# Patient Record
Sex: Female | Born: 1980 | Hispanic: No | Marital: Married | Smoking: Never smoker
Health system: Southern US, Community
[De-identification: ages and names within clinical notes are randomized; demographics above are authoritative.]

## PROBLEM LIST (undated history)

## (undated) ENCOUNTER — Emergency Department (HOSPITAL_COMMUNITY): Admission: EM | Payer: Self-pay

## (undated) DIAGNOSIS — K8020 Calculus of gallbladder without cholecystitis without obstruction: Secondary | ICD-10-CM

## (undated) DIAGNOSIS — Z6839 Body mass index (BMI) 39.0-39.9, adult: Secondary | ICD-10-CM

## (undated) DIAGNOSIS — E119 Type 2 diabetes mellitus without complications: Secondary | ICD-10-CM

## (undated) DIAGNOSIS — A048 Other specified bacterial intestinal infections: Secondary | ICD-10-CM

## (undated) DIAGNOSIS — I8393 Asymptomatic varicose veins of bilateral lower extremities: Secondary | ICD-10-CM

## (undated) HISTORY — DX: Asymptomatic varicose veins of bilateral lower extremities: I83.93

## (undated) HISTORY — DX: Type 2 diabetes mellitus without complications: E11.9

## (undated) HISTORY — DX: Other specified bacterial intestinal infections: A04.8

## (undated) HISTORY — DX: Calculus of gallbladder without cholecystitis without obstruction: K80.20

---

## 2001-01-19 ENCOUNTER — Ambulatory Visit (HOSPITAL_COMMUNITY): Admission: RE | Admit: 2001-01-19 | Discharge: 2001-01-19 | Payer: Self-pay | Admitting: *Deleted

## 2001-05-02 ENCOUNTER — Inpatient Hospital Stay (HOSPITAL_COMMUNITY): Admission: AD | Admit: 2001-05-02 | Discharge: 2001-05-05 | Payer: Self-pay | Admitting: *Deleted

## 2002-10-11 ENCOUNTER — Other Ambulatory Visit: Admission: RE | Admit: 2002-10-11 | Discharge: 2002-10-11 | Payer: Self-pay | Admitting: Obstetrics and Gynecology

## 2003-02-22 ENCOUNTER — Inpatient Hospital Stay (HOSPITAL_COMMUNITY): Admission: AD | Admit: 2003-02-22 | Discharge: 2003-02-24 | Payer: Self-pay | Admitting: Obstetrics and Gynecology

## 2004-10-08 ENCOUNTER — Ambulatory Visit (HOSPITAL_COMMUNITY): Admission: RE | Admit: 2004-10-08 | Discharge: 2004-10-08 | Payer: Self-pay | Admitting: Obstetrics & Gynecology

## 2004-10-17 ENCOUNTER — Inpatient Hospital Stay (HOSPITAL_COMMUNITY): Admission: AD | Admit: 2004-10-17 | Discharge: 2004-10-17 | Payer: Self-pay | Admitting: Obstetrics and Gynecology

## 2004-10-29 ENCOUNTER — Inpatient Hospital Stay (HOSPITAL_COMMUNITY): Admission: AD | Admit: 2004-10-29 | Discharge: 2004-10-31 | Payer: Self-pay | Admitting: Obstetrics

## 2011-11-17 DIAGNOSIS — K8020 Calculus of gallbladder without cholecystitis without obstruction: Secondary | ICD-10-CM

## 2011-11-17 HISTORY — DX: Calculus of gallbladder without cholecystitis without obstruction: K80.20

## 2012-09-30 ENCOUNTER — Inpatient Hospital Stay (HOSPITAL_COMMUNITY)
Admission: EM | Admit: 2012-09-30 | Discharge: 2012-10-06 | DRG: 419 | Disposition: A | Discharge status: Home / Self Care | Payer: Medicaid Other | Attending: General Surgery | Admitting: General Surgery

## 2012-09-30 ENCOUNTER — Emergency Department (HOSPITAL_COMMUNITY): Payer: Medicaid Other

## 2012-09-30 ENCOUNTER — Encounter (HOSPITAL_COMMUNITY): Payer: Self-pay | Admitting: *Deleted

## 2012-09-30 DIAGNOSIS — Z6839 Body mass index (BMI) 39.0-39.9, adult: Secondary | ICD-10-CM

## 2012-09-30 DIAGNOSIS — Z6841 Body Mass Index (BMI) 40.0 and over, adult: Secondary | ICD-10-CM

## 2012-09-30 DIAGNOSIS — K819 Cholecystitis, unspecified: Secondary | ICD-10-CM

## 2012-09-30 DIAGNOSIS — K8010 Calculus of gallbladder with chronic cholecystitis without obstruction: Secondary | ICD-10-CM

## 2012-09-30 DIAGNOSIS — E669 Obesity, unspecified: Secondary | ICD-10-CM | POA: Diagnosis present

## 2012-09-30 DIAGNOSIS — K8001 Calculus of gallbladder with acute cholecystitis with obstruction: Secondary | ICD-10-CM | POA: Diagnosis present

## 2012-09-30 DIAGNOSIS — K8063 Calculus of gallbladder and bile duct with acute cholecystitis with obstruction: Principal | ICD-10-CM | POA: Diagnosis present

## 2012-09-30 DIAGNOSIS — K8050 Calculus of bile duct without cholangitis or cholecystitis without obstruction: Secondary | ICD-10-CM

## 2012-09-30 DIAGNOSIS — K7689 Other specified diseases of liver: Secondary | ICD-10-CM | POA: Diagnosis present

## 2012-09-30 DIAGNOSIS — R7989 Other specified abnormal findings of blood chemistry: Secondary | ICD-10-CM | POA: Diagnosis present

## 2012-09-30 HISTORY — DX: Body mass index (bmi) 39.0-39.9, adult: Z68.39

## 2012-09-30 LAB — COMPREHENSIVE METABOLIC PANEL
ALT: 1138 U/L — ABNORMAL HIGH (ref 0–35)
AST: 382 U/L — ABNORMAL HIGH (ref 0–37)
Albumin: 4 g/dL (ref 3.5–5.2)
Calcium: 9.7 mg/dL (ref 8.4–10.5)
Creatinine, Ser: 0.56 mg/dL (ref 0.50–1.10)
GFR calc non Af Amer: >90 mL/min (ref >90)
Sodium: 140 mEq/L (ref 135–145)
Total Protein: 8.3 g/dL (ref 6.0–8.3)

## 2012-09-30 LAB — CBC WITH DIFFERENTIAL/PLATELET
Basophils Absolute: 0 10*3/uL (ref 0.0–0.1)
Basophils Relative: 0 % (ref 0–1)
Eosinophils Absolute: 0.1 10*3/uL (ref 0.0–0.7)
Eosinophils Relative: 1 % (ref 0–5)
HCT: 40.1 % (ref 36.0–46.0)
Lymphocytes Relative: 27 % (ref 12–46)
MCHC: 33.7 g/dL (ref 30.0–36.0)
MCV: 85 fL (ref 78.0–100.0)
Monocytes Absolute: 0.3 10*3/uL (ref 0.1–1.0)
Platelets: 341 10*3/uL (ref 150–400)
RDW: 13.6 % (ref 11.5–15.5)
WBC: 8.1 10*3/uL (ref 4.0–10.5)

## 2012-09-30 LAB — URINE MICROSCOPIC-ADD ON

## 2012-09-30 LAB — URINALYSIS, ROUTINE W REFLEX MICROSCOPIC
Glucose, UA: NEGATIVE mg/dL
Ketones, ur: >80 mg/dL — AB
Nitrite: NEGATIVE
Protein, ur: 30 mg/dL — AB
Specific Gravity, Urine: 1.028 (ref 1.005–1.030)
Urobilinogen, UA: 2 mg/dL — ABNORMAL HIGH (ref 0.0–1.0)
pH: 6.5 (ref 5.0–8.0)

## 2012-09-30 MED ORDER — MORPHINE SULFATE 4 MG/ML IJ SOLN
4.0000 mg | Freq: Once | INTRAMUSCULAR | Status: AC
  Administered 2012-09-30: 4 mg via INTRAVENOUS
  Filled 2012-09-30: qty 1

## 2012-09-30 MED ORDER — PIPERACILLIN-TAZOBACTAM 3.375 G IVPB
3.3750 g | Freq: Once | INTRAVENOUS | Status: AC
  Administered 2012-09-30: 3.375 g via INTRAVENOUS
  Filled 2012-09-30: qty 50

## 2012-09-30 MED ORDER — CIPROFLOXACIN IN D5W 400 MG/200ML IV SOLN
400.0000 mg | Freq: Two times a day (BID) | INTRAVENOUS | Status: DC
  Administered 2012-10-01 – 2012-10-05 (×9): 400 mg via INTRAVENOUS
  Filled 2012-09-30 (×11): qty 200

## 2012-09-30 MED ORDER — SODIUM CHLORIDE 0.9 % IV BOLUS (SEPSIS)
1000.0000 mL | Freq: Once | INTRAVENOUS | Status: AC
  Administered 2012-09-30: 1000 mL via INTRAVENOUS

## 2012-09-30 MED ORDER — KCL IN DEXTROSE-NACL 20-5-0.9 MEQ/L-%-% IV SOLN
INTRAVENOUS | Status: DC
  Administered 2012-10-01: 01:00:00 via INTRAVENOUS
  Filled 2012-09-30 (×4): qty 1000

## 2012-09-30 MED ORDER — ONDANSETRON HCL 4 MG/2ML IJ SOLN
4.0000 mg | Freq: Once | INTRAMUSCULAR | Status: AC
  Administered 2012-09-30: 4 mg via INTRAVENOUS
  Filled 2012-09-30: qty 2

## 2012-09-30 MED ORDER — HYDROMORPHONE HCL PF 1 MG/ML IJ SOLN
1.0000 mg | INTRAMUSCULAR | Status: DC | PRN
Start: 2012-09-30 — End: 2012-10-06
  Administered 2012-10-01 – 2012-10-03 (×4): 1 mg via INTRAVENOUS
  Filled 2012-09-30 (×4): qty 1

## 2012-09-30 MED ORDER — ONDANSETRON HCL 4 MG/2ML IJ SOLN
4.0000 mg | Freq: Four times a day (QID) | INTRAMUSCULAR | Status: DC | PRN
  Administered 2012-10-02: 4 mg via INTRAVENOUS
  Filled 2012-09-30 (×4): qty 2

## 2012-09-30 MED ORDER — SODIUM CHLORIDE 0.9 % IV SOLN
Freq: Once | INTRAVENOUS | Status: AC
Start: 2012-09-30 — End: 2012-09-30
  Administered 2012-09-30: 75 mL/h via INTRAVENOUS

## 2012-09-30 NOTE — ED Notes (Signed)
Per family, pt reports intermittent abdominal pain/nausea/vomiting x8 days. Denies difficulty/pain with urination. States "it hurts when I sit on the toilet".

## 2012-09-30 NOTE — ED Notes (Signed)
Dr. Rancour at bedside. 

## 2012-09-30 NOTE — H&P (Signed)
Vicki Roberts is an 31 y.o. female.   Chief Complaint: abdominal pain HPI: 1 week history of intermittent RUQ abdominal pain.  Worse over last day.  Pain is sharp and located in RUQ.  Pt has nausea and vomiting.  U/S shows gallstones and CBD 11 mm.  Slight elevation of LFT's.  History reviewed. No pertinent past medical history.  History reviewed. No pertinent past surgical history.  No family history on file. Social History:  reports that she has never smoked. She does not have any smokeless tobacco history on file. She reports that she does not drink alcohol. Her drug history not on file.  Allergies: No Known Allergies   (Not in a hospital admission)  Results for orders placed during the hospital encounter of 09/30/12 (from the past 48 hour(s))  CBC WITH DIFFERENTIAL     Status: Normal   Collection Time   09/30/12  5:44 PM      Component Value Range Comment   WBC 8.1  4.0 - 10.5 K/uL    RBC 4.72  3.87 - 5.11 MIL/uL    Hemoglobin 13.5  12.0 - 15.0 g/dL    HCT 16.1  09.6 - 04.5 %    MCV 85.0  78.0 - 100.0 fL    MCH 28.6  26.0 - 34.0 pg    MCHC 33.7  30.0 - 36.0 g/dL    RDW 40.9  81.1 - 91.4 %    Platelets 341  150 - 400 K/uL    Neutrophils Relative 68  43 - 77 %    Neutro Abs 5.5  1.7 - 7.7 K/uL    Lymphocytes Relative 27  12 - 46 %    Lymphs Abs 2.2  0.7 - 4.0 K/uL    Monocytes Relative 4  3 - 12 %    Monocytes Absolute 0.3  0.1 - 1.0 K/uL    Eosinophils Relative 1  0 - 5 %    Eosinophils Absolute 0.1  0.0 - 0.7 K/uL    Basophils Relative 0  0 - 1 %    Basophils Absolute 0.0  0.0 - 0.1 K/uL   COMPREHENSIVE METABOLIC PANEL     Status: Abnormal   Collection Time   09/30/12  5:44 PM      Component Value Range Comment   Sodium 140  135 - 145 mEq/L    Potassium 3.4 (*) 3.5 - 5.1 mEq/L    Chloride 99  96 - 112 mEq/L    CO2 31  19 - 32 mEq/L    Glucose, Bld 125 (*) 70 - 99 mg/dL    BUN 6  6 - 23 mg/dL    Creatinine, Ser 7.82  0.50 - 1.10 mg/dL    Calcium 9.7  8.4 -  10.5 mg/dL    Total Protein 8.3  6.0 - 8.3 g/dL    Albumin 4.0  3.5 - 5.2 g/dL    AST 956 (*) 0 - 37 U/L    ALT 1138 (*) 0 - 35 U/L    Alkaline Phosphatase 167 (*) 39 - 117 U/L    Total Bilirubin 2.2 (*) 0.3 - 1.2 mg/dL    GFR calc non Af Amer >90  >90 mL/min    GFR calc Af Amer >90  >90 mL/min   LIPASE, BLOOD     Status: Normal   Collection Time   09/30/12  5:44 PM      Component Value Range Comment   Lipase 27  11 -  59 U/L   URINALYSIS, ROUTINE W REFLEX MICROSCOPIC     Status: Abnormal   Collection Time   09/30/12  9:52 PM      Component Value Range Comment   Color, Urine AMBER (*) YELLOW BIOCHEMICALS MAY BE AFFECTED BY COLOR   APPearance HAZY (*) CLEAR    Specific Gravity, Urine 1.028  1.005 - 1.030    pH 6.5  5.0 - 8.0    Glucose, UA NEGATIVE  NEGATIVE mg/dL    Hgb urine dipstick MODERATE (*) NEGATIVE    Bilirubin Urine MODERATE (*) NEGATIVE    Ketones, ur >80 (*) NEGATIVE mg/dL    Protein, ur 30 (*) NEGATIVE mg/dL    Urobilinogen, UA 2.0 (*) 0.0 - 1.0 mg/dL    Nitrite NEGATIVE  NEGATIVE    Leukocytes, UA SMALL (*) NEGATIVE   PREGNANCY, URINE     Status: Normal   Collection Time   09/30/12  9:52 PM      Component Value Range Comment   Preg Test, Ur NEGATIVE  NEGATIVE   URINE MICROSCOPIC-ADD ON     Status: Abnormal   Collection Time   09/30/12  9:52 PM      Component Value Range Comment   Squamous Epithelial / LPF MANY (*) RARE    WBC, UA 0-2  <3 WBC/hpf    RBC / HPF 3-6  <3 RBC/hpf    Bacteria, UA RARE  RARE    Urine-Other MUCOUS PRESENT      US Abdomen Complete  09/30/2012  *RADIOLOGY REPORT*  Clinical Data:  Abdominal pain with nausea vomiting for 8-9 days.  COMPLETE ABDOMINAL ULTRASOUND  Comparison:  None.  Findings:  Gallbladder:  Abnormal gallbladder with multiple shadowing stones. Wall thickness 6.2 mm.  Positive Murphy's sign of pericholecystic fluid.  Common bile duct:  Increased in size measuring 11 mm extrahepatic location.  Liver:  Moderate enlargement.   Increased echogenicity. No focal defects.  No definite intrahepatic ductal dilatation.  IVC:  Appears normal.  Pancreas:  No focal abnormality seen.  Spleen:  Normal measuring 5.7 cm.  Right Kidney:  Normal measuring 12.3 cm.  Left Kidney:  Overall normal in size.  A cystic area in the upper pole measures  1.0 x 1.2 x 1.3 cm.  Abdominal aorta:  No aneurysm identified.  IMPRESSION: Cholelithiasis with concern for acute cholecystitis.  Dilated common bile duct of 11 mm concerning for common bile duct stone. Hepatomegaly.   Original Report Authenticated By: Davonna Belling, M.D.     Review of Systems  Constitutional: Negative.   HENT: Negative.   Eyes: Negative.   Cardiovascular: Negative.   Gastrointestinal: Positive for nausea, vomiting and abdominal pain.  Genitourinary: Negative.   Musculoskeletal: Negative.   Skin: Negative.   Neurological: Negative.   Endo/Heme/Allergies: Negative.   Psychiatric/Behavioral: Negative.     Blood pressure 129/73, pulse 66, temperature 98.2 F (36.8 C), temperature source Oral, resp. rate 20, last menstrual period 09/23/2012, SpO2 99.00%. Physical Exam  Constitutional: She is oriented to person, place, and time. She appears well-developed and well-nourished.  HENT:  Head: Normocephalic and atraumatic.  Eyes: EOM are normal. Pupils are equal, round, and reactive to light. No scleral icterus.  Neck: Normal range of motion. Neck supple.  Cardiovascular: Normal rate and regular rhythm.   Respiratory: Effort normal and breath sounds normal.  GI: There is tenderness in the right upper quadrant and epigastric area. There is positive Murphy's sign.  Musculoskeletal: Normal range of motion.  Neurological: She  is alert and oriented to person, place, and time.  Skin: Skin is warm and dry.  Psychiatric: She has a normal mood and affect. Her behavior is normal. Judgment and thought content normal.     Assessment/Plan Acute cholecystitis ADMIT IVF ABX NPO OR IN  AM.  The procedure has been discussed with the patient. Operative and non operative treatments have been discussed. Risks of surgery include bleeding, infection,  Common bile duct injury,  Injury to the stomach,liver, colon,small intestine, abdominal wall,  Diaphragm,  Major blood vessels,  And the need for an open procedure.  Other risks include worsening of medical problems, death,  DVT and pulmonary embolism, and cardiovascular events.   Medical options have also been discussed. The patient has been informed of long term expectations of surgery and non surgical options,  The patient agrees to proceed.    Hadas Jessop A. 09/30/2012, 10:37 PM

## 2012-09-30 NOTE — ED Notes (Signed)
The pt has had mid abd pain for 8-9 days with n and vomiting.  lmp last friday

## 2012-09-30 NOTE — ED Provider Notes (Signed)
History  This chart was scribed for Vicki Octave, MD by Bennett Scrape, ED Scribe. This patient was seen in room A01C/A01C and the patient's care was started at 7:38 PM.  CSN: [id number]  Arrival date & time 09/30/12  1711   First MD Initiated Contact with Patient 09/30/12 1938      Chief Complaint  Patient presents with  . Abdominal Pain    The history is provided by the patient. No language interpreter was used (Family in room provided translation).    Vicki Roberts is a 31 y.o. female who presents to the Emergency Department complaining of 8 to 9 days of intermittent, non-radiating epigastric abdominal pain with associated nausea and emesis, last episode occuring 3 hours ago. She denies fevers, urinary symptoms, vaginal bleeding, and CP as associated symptoms. She reports that her LNMP was 09/23/12. She denies being on birth control currently. She denies having any prior surgeries and doesn't have a h/o chronic medical conditions. She denies smoking and alcohol use.  History reviewed. No pertinent past medical history.  History reviewed. No pertinent past surgical history.  No family history on file.  History  Substance Use Topics  . Smoking status: Never Smoker   . Smokeless tobacco: Not on file  . Alcohol Use: No    .No OB history provided.  Review of Systems  A complete 10 system review of systems was obtained and all systems are negative except as noted in the HPI and PMH.   Allergies  Review of patient's allergies indicates no known allergies.  Home Medications   Current Outpatient Rx  Name  Route  Sig  Dispense  Refill  . ACETAMINOPHEN 500 MG PO TABS   Oral   Take 500 mg by mouth every 6 (six) hours as needed. For pain           Triage Vitals: BP 129/73  Pulse 66  Temp 98.2 F (36.8 C) (Oral)  Resp 20  SpO2 99%  LMP 09/23/2012  Physical Exam  Nursing note and vitals reviewed. Constitutional: She is oriented to person, place, and time.  She appears well-developed and well-nourished. No distress.  HENT:  Head: Normocephalic and atraumatic.  Mouth/Throat: Oropharynx is clear and moist.  Eyes: Conjunctivae normal and EOM are normal. Pupils are equal, round, and reactive to light.  Neck: Neck supple. No tracheal deviation present.  Cardiovascular: Normal rate, regular rhythm, normal heart sounds and intact distal pulses.   Pulmonary/Chest: Effort normal and breath sounds normal. No respiratory distress.  Abdominal: Soft. There is tenderness (moderate tenderness in the RUQ and epigastrium ). There is guarding (mild). There is no rebound.       No CVA tenderness  Musculoskeletal: Normal range of motion.  Neurological: She is alert and oriented to person, place, and time.  Skin: Skin is warm and dry.  Psychiatric: She has a normal mood and affect. Her behavior is normal.    ED Course  Procedures (including critical care time)  DIAGNOSTIC STUDIES: Oxygen Saturation is 99% on room air, normal by my interpretation.    COORDINATION OF CARE:  7:49 PM- Discussed treatment plan which includes US of the abdomen with pt at bedside and pt agreed to plan.  8:00 PM- Ordered 1,000 mL of Bolus, 4 mg injection of Zofran and 4 mg injection of morphine.  9:25 PM-Consult complete with general surgery. Patient case explained and discussed. General surgery agrees to admission patient for further evaluation and treatment. Requests that I call GI for  admission. Call ended at 9:26 PM.  9:37 PM- Consult complete with Dr. Laural Benes, GI. Patient case explained and discussed. Dr. Laural Benes, GI agrees to admit patient for further evaluation and treatment. Call ended at 9:38 PM.  9:45 PM- Ordered 3.375 g of Zosyn and 1,000 mL bolus  10:13 PM- Informed pt of consults and plans for admission with cholecystectomy scheduled in the morning. Pt is agreeable at this time to this plan.  Labs Reviewed  URINALYSIS, ROUTINE W REFLEX MICROSCOPIC - Abnormal;  Notable for the following:    Color, Urine AMBER (*)  BIOCHEMICALS MAY BE AFFECTED BY COLOR   APPearance HAZY (*)     Hgb urine dipstick MODERATE (*)     Bilirubin Urine MODERATE (*)     Ketones, ur >80 (*)     Protein, ur 30 (*)     Urobilinogen, UA 2.0 (*)     Leukocytes, UA SMALL (*)     All other components within normal limits  COMPREHENSIVE METABOLIC PANEL - Abnormal; Notable for the following:    Potassium 3.4 (*)     Glucose, Bld 125 (*)     AST 382 (*)     ALT 1138 (*)     Alkaline Phosphatase 167 (*)     Total Bilirubin 2.2 (*)     All other components within normal limits  URINE MICROSCOPIC-ADD ON - Abnormal; Notable for the following:    Squamous Epithelial / LPF MANY (*)     All other components within normal limits  PREGNANCY, URINE  CBC WITH DIFFERENTIAL  LIPASE, BLOOD   US Abdomen Complete  09/30/2012  *RADIOLOGY REPORT*  Clinical Data:  Abdominal pain with nausea vomiting for 8-9 days.  COMPLETE ABDOMINAL ULTRASOUND  Comparison:  None.  Findings:  Gallbladder:  Abnormal gallbladder with multiple shadowing stones. Wall thickness 6.2 mm.  Positive Murphy's sign of pericholecystic fluid.  Common bile duct:  Increased in size measuring 11 mm extrahepatic location.  Liver:  Moderate enlargement.  Increased echogenicity. No focal defects.  No definite intrahepatic ductal dilatation.  IVC:  Appears normal.  Pancreas:  No focal abnormality seen.  Spleen:  Normal measuring 5.7 cm.  Right Kidney:  Normal measuring 12.3 cm.  Left Kidney:  Overall normal in size.  A cystic area in the upper pole measures  1.0 x 1.2 x 1.3 cm.  Abdominal aorta:  No aneurysm identified.  IMPRESSION: Cholelithiasis with concern for acute cholecystitis.  Dilated common bile duct of 11 mm concerning for common bile duct stone. Hepatomegaly.   Original Report Authenticated By: Davonna Belling, M.D.      No diagnosis found.    MDM  Epigastric pain for the past 8 days with nausea and vomiting. No fever,  chills, change in bowel habits.  TTP RUQ with guarding.  Elevated LFTs.  Will obtain US.  Ultrasound shows acute cholecystitis with large common bile duct stone. Discussed with Dr. Rosezena Sensor nurse. He will admit the patient but would like a GI consult. Discussed with Dr. Laural Benes who treat the patient information and will see the patient in the morning.   I personally performed the services described in this documentation, which was scribed in my presence. The recorded information has been reviewed and is accurate.    Vicki Octave, MD 09/30/12 2241

## 2012-10-01 ENCOUNTER — Encounter (HOSPITAL_COMMUNITY): Payer: Self-pay | Admitting: *Deleted

## 2012-10-01 ENCOUNTER — Encounter (HOSPITAL_COMMUNITY): Admission: EM | Disposition: A | Discharge status: Home / Self Care | Payer: Self-pay | Source: Home / Self Care

## 2012-10-01 ENCOUNTER — Observation Stay (HOSPITAL_COMMUNITY): Payer: Medicaid Other | Admitting: *Deleted

## 2012-10-01 HISTORY — PX: CHOLECYSTECTOMY: SHX55

## 2012-10-01 LAB — COMPREHENSIVE METABOLIC PANEL
ALT: 760 U/L — ABNORMAL HIGH (ref 0–35)
AST: 145 U/L — ABNORMAL HIGH (ref 0–37)
Alkaline Phosphatase: 134 U/L — ABNORMAL HIGH (ref 39–117)
CO2: 28 mEq/L (ref 19–32)
Calcium: 8.3 mg/dL — ABNORMAL LOW (ref 8.4–10.5)
Creatinine, Ser: 0.66 mg/dL (ref 0.50–1.10)
Sodium: 139 mEq/L (ref 135–145)
Total Bilirubin: 1.1 mg/dL (ref 0.3–1.2)
Total Protein: 6.9 g/dL (ref 6.0–8.3)

## 2012-10-01 LAB — CBC
MCHC: 33.7 g/dL (ref 30.0–36.0)
MCV: 86.8 fL (ref 78.0–100.0)
Platelets: 317 10*3/uL (ref 150–400)
RDW: 13.9 % (ref 11.5–15.5)
WBC: 7.8 10*3/uL (ref 4.0–10.5)

## 2012-10-01 SURGERY — LAPAROSCOPIC CHOLECYSTECTOMY WITH INTRAOPERATIVE CHOLANGIOGRAM
Anesthesia: General | Site: Abdomen | Wound class: Clean Contaminated

## 2012-10-01 MED ORDER — SODIUM CHLORIDE 0.9 % IR SOLN
Status: DC | PRN
  Administered 2012-10-01: 1000 mL

## 2012-10-01 MED ORDER — GLYCOPYRROLATE 0.2 MG/ML IJ SOLN
INTRAMUSCULAR | Status: DC | PRN
  Administered 2012-10-01: 0.6 mg via INTRAVENOUS

## 2012-10-01 MED ORDER — ALBUTEROL SULFATE HFA 108 (90 BASE) MCG/ACT IN AERS
INHALATION_SPRAY | RESPIRATORY_TRACT | Status: DC | PRN
  Administered 2012-10-01: 2 via RESPIRATORY_TRACT

## 2012-10-01 MED ORDER — LACTATED RINGERS IV SOLN
INTRAVENOUS | Status: DC | PRN
  Administered 2012-10-01 (×2): via INTRAVENOUS

## 2012-10-01 MED ORDER — NEOSTIGMINE METHYLSULFATE 1 MG/ML IJ SOLN
INTRAMUSCULAR | Status: DC | PRN
  Administered 2012-10-01: 4 mg via INTRAVENOUS

## 2012-10-01 MED ORDER — HYDROMORPHONE HCL PF 1 MG/ML IJ SOLN
0.2500 mg | INTRAMUSCULAR | Status: DC | PRN
  Administered 2012-10-01 (×3): 0.5 mg via INTRAVENOUS

## 2012-10-01 MED ORDER — INFLUENZA VIRUS VACC SPLIT PF IM SUSP
0.5000 mL | INTRAMUSCULAR | Status: AC
  Filled 2012-10-01: qty 0.5

## 2012-10-01 MED ORDER — ACETAMINOPHEN 325 MG PO TABS
650.0000 mg | ORAL_TABLET | Freq: Four times a day (QID) | ORAL | Status: DC | PRN
  Administered 2012-10-04 – 2012-10-05 (×2): 650 mg via ORAL
  Filled 2012-10-01 (×2): qty 2

## 2012-10-01 MED ORDER — LIDOCAINE HCL (CARDIAC) 20 MG/ML IV SOLN
INTRAVENOUS | Status: DC | PRN
  Administered 2012-10-01: 80 mg via INTRAVENOUS

## 2012-10-01 MED ORDER — MIDAZOLAM HCL 5 MG/5ML IJ SOLN
INTRAMUSCULAR | Status: DC | PRN
  Administered 2012-10-01: 2 mg via INTRAVENOUS

## 2012-10-01 MED ORDER — ACETAMINOPHEN 650 MG RE SUPP: 650.0000 mg | Freq: Four times a day (QID) | RECTAL | Status: DC | PRN

## 2012-10-01 MED ORDER — ONDANSETRON HCL 4 MG/2ML IJ SOLN
INTRAMUSCULAR | Status: DC | PRN
  Administered 2012-10-01: 4 mg via INTRAVENOUS

## 2012-10-01 MED ORDER — OXYCODONE HCL 5 MG PO TABS
5.0000 mg | ORAL_TABLET | ORAL | Status: DC | PRN
  Administered 2012-10-01 – 2012-10-04 (×8): 5 mg via ORAL
  Filled 2012-10-01 (×4): qty 1
  Filled 2012-10-01: qty 2
  Filled 2012-10-01 (×2): qty 1

## 2012-10-01 MED ORDER — ROCURONIUM BROMIDE 100 MG/10ML IV SOLN
INTRAVENOUS | Status: DC | PRN
  Administered 2012-10-01: 10 mg via INTRAVENOUS
  Administered 2012-10-01: 40 mg via INTRAVENOUS

## 2012-10-01 MED ORDER — CEFAZOLIN SODIUM 1-5 GM-% IV SOLN
2.0000 g | Freq: Once | INTRAVENOUS | Status: AC
  Administered 2012-10-01: 2 g via INTRAVENOUS
  Filled 2012-10-01: qty 100

## 2012-10-01 MED ORDER — SODIUM CHLORIDE 0.9 % IV SOLN
INTRAVENOUS | Status: DC
  Administered 2012-10-01 – 2012-10-04 (×5): via INTRAVENOUS

## 2012-10-01 MED ORDER — ONDANSETRON HCL 4 MG/2ML IJ SOLN
4.0000 mg | Freq: Four times a day (QID) | INTRAMUSCULAR | Status: DC | PRN
  Administered 2012-10-01 – 2012-10-03 (×4): 4 mg via INTRAVENOUS
  Filled 2012-10-01: qty 2

## 2012-10-01 MED ORDER — DEXAMETHASONE SODIUM PHOSPHATE 4 MG/ML IJ SOLN
INTRAMUSCULAR | Status: DC | PRN
  Administered 2012-10-01: 4 mg via INTRAVENOUS

## 2012-10-01 MED ORDER — HEMOSTATIC AGENTS (NO CHARGE) OPTIME
TOPICAL | Status: DC | PRN
  Administered 2012-10-01: 1 via TOPICAL

## 2012-10-01 MED ORDER — FENTANYL CITRATE 0.05 MG/ML IJ SOLN
INTRAMUSCULAR | Status: DC | PRN
  Administered 2012-10-01: 100 ug via INTRAVENOUS
  Administered 2012-10-01 (×3): 50 ug via INTRAVENOUS

## 2012-10-01 MED ORDER — SUCCINYLCHOLINE CHLORIDE 20 MG/ML IJ SOLN
INTRAMUSCULAR | Status: DC | PRN
  Administered 2012-10-01: 100 mg via INTRAVENOUS

## 2012-10-01 MED ORDER — PROPOFOL 10 MG/ML IV BOLUS
INTRAVENOUS | Status: DC | PRN
  Administered 2012-10-01: 160 mg via INTRAVENOUS

## 2012-10-01 MED ORDER — PANTOPRAZOLE SODIUM 40 MG IV SOLR
40.0000 mg | Freq: Every day | INTRAVENOUS | Status: DC
  Administered 2012-10-01 – 2012-10-03 (×3): 40 mg via INTRAVENOUS
  Filled 2012-10-01 (×4): qty 40

## 2012-10-01 SURGICAL SUPPLY — 35 items
APPLIER CLIP 5 13 M/L LIGAMAX5 (MISCELLANEOUS) ×2
BLADE SURG ROTATE 9660 (MISCELLANEOUS) IMPLANT
CANISTER SUCTION 2500CC (MISCELLANEOUS) ×2 IMPLANT
CHLORAPREP W/TINT 26ML (MISCELLANEOUS) ×2 IMPLANT
CLIP APPLIE 5 13 M/L LIGAMAX5 (MISCELLANEOUS) ×1 IMPLANT
CLOTH BEACON ORANGE TIMEOUT ST (SAFETY) ×2 IMPLANT
COVER MAYO STAND STRL (DRAPES) ×2 IMPLANT
COVER SURGICAL LIGHT HANDLE (MISCELLANEOUS) ×2 IMPLANT
DECANTER SPIKE VIAL GLASS SM (MISCELLANEOUS) ×4 IMPLANT
DERMABOND ADVANCED (GAUZE/BANDAGES/DRESSINGS) ×1
DERMABOND ADVANCED .7 DNX12 (GAUZE/BANDAGES/DRESSINGS) ×1 IMPLANT
DRAPE C-ARM 42X72 X-RAY (DRAPES) ×2 IMPLANT
ELECT REM PT RETURN 9FT ADLT (ELECTROSURGICAL) ×2
ELECTRODE REM PT RTRN 9FT ADLT (ELECTROSURGICAL) ×1 IMPLANT
GLOVE BIO SURGEON STRL SZ7 (GLOVE) ×2 IMPLANT
GLOVE BIOGEL PI IND STRL 7.5 (GLOVE) ×1 IMPLANT
GLOVE BIOGEL PI INDICATOR 7.5 (GLOVE) ×1
GOWN STRL NON-REIN LRG LVL3 (GOWN DISPOSABLE) ×8 IMPLANT
KIT BASIN OR (CUSTOM PROCEDURE TRAY) ×2 IMPLANT
KIT ROOM TURNOVER OR (KITS) ×2 IMPLANT
NS IRRIG 1000ML POUR BTL (IV SOLUTION) ×2 IMPLANT
PAD ARMBOARD 7.5X6 YLW CONV (MISCELLANEOUS) ×2 IMPLANT
POUCH SPECIMEN RETRIEVAL 10MM (ENDOMECHANICALS) ×2 IMPLANT
SCISSORS LAP 5X35 DISP (ENDOMECHANICALS) ×2 IMPLANT
SET CHOLANGIOGRAPH 5 50 .035 (SET/KITS/TRAYS/PACK) ×2 IMPLANT
SET IRRIG TUBING LAPAROSCOPIC (IRRIGATION / IRRIGATOR) ×2 IMPLANT
SLEEVE ENDOPATH XCEL 5M (ENDOMECHANICALS) ×4 IMPLANT
SPECIMEN JAR SMALL (MISCELLANEOUS) ×2 IMPLANT
SUT MNCRL AB 4-0 PS2 18 (SUTURE) ×2 IMPLANT
SUT VICRYL 0 UR6 27IN ABS (SUTURE) ×2 IMPLANT
TOWEL OR 17X24 6PK STRL BLUE (TOWEL DISPOSABLE) ×2 IMPLANT
TOWEL OR 17X26 10 PK STRL BLUE (TOWEL DISPOSABLE) ×2 IMPLANT
TRAY LAPAROSCOPIC (CUSTOM PROCEDURE TRAY) ×2 IMPLANT
TROCAR XCEL BLUNT TIP 100MML (ENDOMECHANICALS) ×2 IMPLANT
TROCAR XCEL NON-BLD 5MMX100MML (ENDOMECHANICALS) ×2 IMPLANT

## 2012-10-01 NOTE — Progress Notes (Signed)
Patient ID: Vicki Roberts, female   DOB: [birth year], 31 y.o.   MRN: [id number]   Consult received- pt is in surgery currently for lap chole and IOC. We will be available for ERCP if needed, and can be arranged for tomorrow or Monday.

## 2012-10-01 NOTE — Preoperative (Signed)
Beta Blockers   Reason not to administer Beta Blockers:Not Applicable 

## 2012-10-01 NOTE — Progress Notes (Signed)
Nutrition Brief Note  Patient identified on the Malnutrition Screening Tool (MST) Report  Body mass index is 39.32 kg/(m^2). Pt meets criteria for obese based on current BMI.   Current diet order is clear liquids due to gall stones with expected surgery.  Labs and medications reviewed.   RD spoke with pt via interpreter. She reports she does not know her usual wt, but has not noticed any recent wt change.  Pt states she may have lost a few pounds to due inability to eat for 2-3 days PTA.  No nutrition interventions warranted at this time. If nutrition issues arise, please consult RD.   Loyce Dys, MS RD LDN Clinical Inpatient Dietitian Pager: [phone] Weekend/After hours pager: [phone]

## 2012-10-01 NOTE — Op Note (Signed)
Preoperative diagnosis: Acute cholecystitis Postoperative diagnosis: SAA Procedure: Laparoscopic cholecystectomy Surgeon: Dr. Harden Mo EBL: 50 cc Specimen: gb and contents Anesthesia: GETA Drains: none Complications: none Sponge and needle counts correct times two Disposition to recovery stable  Indications: 31 yof with ruq pain and u/s consistent with acute cholecystitis.  Her liver function tests were improved this morning.  We discussed proceeding with lap chole today.  This was done via interpreter.  Procedure: After informed consent was obtained, the patient was taken to the operating room.  She was adminstered cefoxitin.  SCDs were placed.  She was then placed under general anesthesia without complication.  She was then prepped and draped in the standard sterile surgical fashion.  A surgical timeout was then performed.  I infiltrated marcaine below the umbilicus.  I then made a vertical incision and entered the fascia sharply. I entered the peritoneum bluntly.  I then placed a 0 Vicryl pursestring suture through the fascia. A Hassan trocar was introduced. I then insufflated the abdomen to 15 mm mercury pressure. I then placed 3 further 5 mm trocars in the epigastrium and right upper quadrant under direct vision without complication. There was noted to be bile staining in the right upper quadrant as well as around the gallbladder. The gallbladder was consistent with acute cholecystitis. I removed the omentum from the gallbladder. The gallbladder was too tense to be able to really grasp so I aspirated it. I then retracted the gallbladder cephalad and lateral. I eventually was able to obtain the critical view of safety. I intended to perform a cholangiogram but the duct was very friable. I clipped the artery and divided it. There may have been more duct I did not want to dissect any further down below as I was concerned of causing injury. I then clipped the duct distally. I evacuated a fair  amount of small stones proximally. So there are stones that appear to be proximal and may be in the common duct right now. I did not do a cholangiogram as the cystic duct was very thin-walled as well. I then clipped the duct 3 times and divided it. I then removed the gallbladder from the liver bed with some difficulty as it was very adherent. I placed in an Endo Catch bag and removed from the umbilicus. I then copiously irrigated this and obtained hemostasis. I placed a piece of Surgicel Snow in the bed of the gallbladder. I then viewed my entry site and there was no evidence of injury. I then removed my Hasson trocar and tied my pursestring suture down. There was still a bit of a defect I placed 2 additional 0 Vicryl figure-of-eight sutures to obliterate this defect. I then removed the trocars and desufflated the abdomen. I then closed these with 4-0 Monocryl and Dermabond. She tolerated this well was extubated in the operating room and transferred to the recovery room in stable condition.

## 2012-10-01 NOTE — Anesthesia Postprocedure Evaluation (Signed)
  Anesthesia Post-op Note  Patient: Vicki Roberts  Procedure(s) Performed: Procedure(s) (LRB) with comments: LAPAROSCOPIC CHOLECYSTECTOMY WITH INTRAOPERATIVE CHOLANGIOGRAM (N/A)  Patient Location: PACU  Anesthesia Type:General  Level of Consciousness: awake  Airway and Oxygen Therapy: Patient Spontanous Breathing  Post-op Pain: mild  Post-op Assessment: Post-op Vital signs reviewed  Post-op Vital Signs: Reviewed  Complications: No apparent anesthesia complications

## 2012-10-01 NOTE — Transfer of Care (Signed)
Immediate Anesthesia Transfer of Care Note  Patient: Vicki Roberts  Procedure(s) Performed: Procedure(s) (LRB) with comments: LAPAROSCOPIC CHOLECYSTECTOMY WITH INTRAOPERATIVE CHOLANGIOGRAM (N/A)  Patient Location: PACU  Anesthesia Type:General  Level of Consciousness: awake, oriented and patient cooperative  Airway & Oxygen Therapy: Patient Spontanous Breathing and Patient connected to nasal cannula oxygen  Post-op Assessment: Report given to PACU RN and Post -op Vital signs reviewed and stable  Post vital signs: Reviewed and stable  Complications: No apparent anesthesia complications

## 2012-10-01 NOTE — Progress Notes (Signed)
Day of Surgery  Subjective: Still with ruq pain  Objective: Vital signs in last 24 hours: Temp:  [98.2 F (36.8 C)-99.1 F (37.3 C)] 98.6 F (37 C) (11/16 0931) Pulse Rate:  [66-76] 72  (11/16 0931) Resp:  [18-20] 20  (11/16 0931) BP: (88-129)/(49-73) 121/69 mmHg (11/16 0931) SpO2:  [98 %-99 %] 99 % (11/16 0931) Weight:  [215 lb (97.523 kg)] 215 lb (97.523 kg) (11/15 2332)    Intake/Output from previous day:   Intake/Output this shift:    GI: obese, tender ruq  Lab Results:   [facility] 10/01/12 0539 09/30/12 1744  WBC 7.8 8.1  HGB 12.6 13.5  HCT 37.4 40.1  PLT 317 341   BMET  Basename 10/01/12 0539 09/30/12 1744  NA 139 140  K 3.2* 3.4*  CL 102 99  CO2 28 31  GLUCOSE 137* 125*  BUN 6 6  CREATININE 0.66 0.56  CALCIUM 8.3* 9.7   PT/INR No results found for this basename: LABPROT:2,INR:2 in the last 72 hours ABG No results found for this basename: PHART:2,PCO2:2,PO2:2,HCO3:2 in the last 72 hours  Studies/Results: US Abdomen Complete  09/30/2012  *RADIOLOGY REPORT*  Clinical Data:  Abdominal pain with nausea vomiting for 8-9 days.  COMPLETE ABDOMINAL ULTRASOUND  Comparison:  None.  Findings:  Gallbladder:  Abnormal gallbladder with multiple shadowing stones. Wall thickness 6.2 mm.  Positive Murphy's sign of pericholecystic fluid.  Common bile duct:  Increased in size measuring 11 mm extrahepatic location.  Liver:  Moderate enlargement.  Increased echogenicity. No focal defects.  No definite intrahepatic ductal dilatation.  IVC:  Appears normal.  Pancreas:  No focal abnormality seen.  Spleen:  Normal measuring 5.7 cm.  Right Kidney:  Normal measuring 12.3 cm.  Left Kidney:  Overall normal in size.  A cystic area in the upper pole measures  1.0 x 1.2 x 1.3 cm.  Abdominal aorta:  No aneurysm identified.  IMPRESSION: Cholelithiasis with concern for acute cholecystitis.  Dilated common bile duct of 11 mm concerning for common bile duct stone. Hepatomegaly.   Original Report  Authenticated By: Davonna Belling, M.D.     Anti-infectives: Anti-infectives     Start     Dose/Rate Route Frequency Ordered Stop   10/01/12 2359   ciprofloxacin (CIPRO) IVPB 400 mg        400 mg 200 mL/hr over 60 Minutes Intravenous 2 times daily 09/30/12 2343     09/30/12 2145   piperacillin-tazobactam (ZOSYN) IVPB 3.375 g        3.375 g 12.5 mL/hr over 240 Minutes Intravenous  Once 09/30/12 2139 10/01/12 0148          Assessment/Plan: Cholecystitis Lfts improved.  Discussed lap chole today through translator I discussed the procedure in detail.   We discussed the risks and benefits of a laparoscopic cholecystectomy and possible cholangiogram including, but not limited to bleeding, infection, injury to surrounding structures such as the intestine or liver, bile leak, retained gallstones, need to convert to an open procedure, prolonged diarrhea, blood clots such as  DVT, common bile duct injury, anesthesia risks, and possible need for additional procedures.  The likelihood of improvement in symptoms and return to the patient's normal status is good. We discussed the typical post-operative recovery course.   [facility] 10/01/2012

## 2012-10-01 NOTE — Anesthesia Preprocedure Evaluation (Addendum)
Anesthesia Evaluation  Patient identified by MRN, date of birth, ID band Patient awake    Reviewed: Allergy & Precautions, H&P , NPO status , Patient's Chart, lab work & pertinent test results  Airway Mallampati: II TM Distance: >3 FB Neck ROM: Full    Dental  (+) Teeth Intact and Dental Advisory Given   Pulmonary neg pulmonary ROS,  breath sounds clear to auscultation        Cardiovascular negative cardio ROS  Rhythm:Regular Rate:Normal     Neuro/Psych negative neurological ROS     GI/Hepatic Neg liver ROS, History of n/v. No vomitting today.   Endo/Other  negative endocrine ROS  Renal/GU negative Renal ROS     Musculoskeletal negative musculoskeletal ROS (+)   Abdominal (+) + obese,   Peds  Hematology negative hematology ROS (+)   Anesthesia Other Findings   Reproductive/Obstetrics                          Anesthesia Physical Anesthesia Plan  ASA: II and emergent  Anesthesia Plan: General   Post-op Pain Management:    Induction: Intravenous  Airway Management Planned: Oral ETT  Additional Equipment:   Intra-op Plan:   Post-operative Plan: Extubation in OR  Informed Consent: I have reviewed the patients History and Physical, chart, labs and discussed the procedure including the risks, benefits and alternatives for the proposed anesthesia with the patient or authorized representative who has indicated his/her understanding and acceptance.   Dental advisory given  Plan Discussed with: Anesthesiologist, Surgeon and CRNA  Anesthesia Plan Comments:        Anesthesia Quick Evaluation

## 2012-10-02 LAB — CBC
HCT: 34.8 % — ABNORMAL LOW (ref 36.0–46.0)
MCHC: 32.8 g/dL (ref 30.0–36.0)
Platelets: 280 10*3/uL (ref 150–400)
RDW: 14 % (ref 11.5–15.5)
WBC: 8.8 10*3/uL (ref 4.0–10.5)

## 2012-10-02 LAB — COMPREHENSIVE METABOLIC PANEL
ALT: 657 U/L — ABNORMAL HIGH (ref 0–35)
Alkaline Phosphatase: 146 U/L — ABNORMAL HIGH (ref 39–117)
BUN: 3 mg/dL — ABNORMAL LOW (ref 6–23)
CO2: 29 mEq/L (ref 19–32)
Chloride: 103 mEq/L (ref 96–112)
GFR calc Af Amer: >90 mL/min (ref >90)
GFR calc non Af Amer: >90 mL/min (ref >90)
Glucose, Bld: 102 mg/dL — ABNORMAL HIGH (ref 70–99)
Potassium: 3.4 mEq/L — ABNORMAL LOW (ref 3.5–5.1)
Sodium: 140 mEq/L (ref 135–145)
Total Bilirubin: 0.8 mg/dL (ref 0.3–1.2)
Total Protein: 6.7 g/dL (ref 6.0–8.3)

## 2012-10-02 MED ORDER — POLYETHYLENE GLYCOL 3350 17 G PO PACK
17.0000 g | PACK | Freq: Every day | ORAL | Status: DC | PRN
  Filled 2012-10-02: qty 1

## 2012-10-02 NOTE — Progress Notes (Signed)
1 Day Post-Op  Subjective: Alert.Stable. Afebrile. States she she has not eaten anything yet, but would like to try. Having moderate amounts of pain. Doesn't feel ready to go home yet.  Lab work reveals AST 145, ALT 760, bilirubin 1.1, WBC 7800, hemoglobin 12.6.    Objective: Vital signs in last 24 hours: Temp:  [98.3 F (36.8 C)-99.6 F (37.6 C)] 98.7 F (37.1 C) (11/17 0543) Pulse Rate:  [67-80] 68  (11/17 0543) Resp:  [16-22] 17  (11/17 0543) BP: (97-115)/(52-68) 114/63 mmHg (11/17 0543) SpO2:  [96 %-100 %] 100 % (11/17 0543)    Intake/Output from previous day: 11/16 0701 - 11/17 0700 In: 3863 [P.O.:960; I.V.:2903] Out: 1100 [Urine:1100] Intake/Output this shift:    General appearance:  female partner in room with her. Alert. Slow to move. Mental status normal. Minimal distress. GI: abdomen obese, soft.Diffuse upper abdominal tenderness but no guarding or rebound, seems appropriate for incisions. Minimal bowel sounds.  Lab Results:  Results for orders placed during the hospital encounter of 09/30/12 (from the past 24 hour(s))  COMPREHENSIVE METABOLIC PANEL     Status: Abnormal   Collection Time   10/02/12  5:06 AM      Component Value Range   Sodium 140  135 - 145 mEq/L   Potassium 3.4 (*) 3.5 - 5.1 mEq/L   Chloride 103  96 - 112 mEq/L   CO2 29  19 - 32 mEq/L   Glucose, Bld 102 (*) 70 - 99 mg/dL   BUN 3 (*) 6 - 23 mg/dL   Creatinine, Ser 1.61  0.50 - 1.10 mg/dL   Calcium 8.3 (*) 8.4 - 10.5 mg/dL   Total Protein 6.7  6.0 - 8.3 g/dL   Albumin 2.9 (*) 3.5 - 5.2 g/dL   AST 096 (*) 0 - 37 U/L   ALT 657 (*) 0 - 35 U/L   Alkaline Phosphatase 146 (*) 39 - 117 U/L   Total Bilirubin 0.8  0.3 - 1.2 mg/dL   GFR calc non Af Amer >90  >90 mL/min   GFR calc Af Amer >90  >90 mL/min  CBC     Status: Abnormal   Collection Time   10/02/12  5:06 AM      Component Value Range   WBC 8.8  4.0 - 10.5 K/uL   RBC 4.00  3.87 - 5.11 MIL/uL   Hemoglobin 11.4 (*) 12.0 - 15.0 g/dL   HCT  04.5 (*) 40.9 - 46.0 %   MCV 87.0  78.0 - 100.0 fL   MCH 28.5  26.0 - 34.0 pg   MCHC 32.8  30.0 - 36.0 g/dL   RDW 81.1  91.4 - 78.2 %   Platelets 280  150 - 400 K/uL     Studies/Results: @RISRSLT24 @     . ciprofloxacin  400 mg Intravenous BID  . influenza  inactive virus vaccine  0.5 mL Intramuscular Tomorrow-1000  . pantoprazole (PROTONIX) IV  40 mg Intravenous QHS     Assessment/Plan: s/p Procedure(s): LAPAROSCOPIC CHOLECYSTECTOMY WITH INTRAOPERATIVE CHOLANGIOGRAM  POD #1.Laparoscopic cholecystectomy for severe acute cholecystitis. Cholangiogram could not be done due to stones in the cystic duct. Seems stable. We'll try to begin diet Abnormal LFTs but normal bilirubin. No evidence of CBD obstruction. Possibly just dur to inflammation and surgery.  Hold off on GI consult or MRCP Check labs tomorrow. Discharge tomorrow if continues to improve.  Patient Active Hospital Problem List: No active hospital problems.   LOS: 2 days  Vicki Roberts M 10/02/2012  . .prob

## 2012-10-03 ENCOUNTER — Observation Stay (HOSPITAL_COMMUNITY): Payer: Medicaid Other

## 2012-10-03 ENCOUNTER — Encounter (HOSPITAL_COMMUNITY): Payer: Self-pay | Admitting: General Surgery

## 2012-10-03 ENCOUNTER — Inpatient Hospital Stay (HOSPITAL_COMMUNITY): Payer: Medicaid Other

## 2012-10-03 DIAGNOSIS — R7989 Other specified abnormal findings of blood chemistry: Secondary | ICD-10-CM

## 2012-10-03 DIAGNOSIS — K819 Cholecystitis, unspecified: Secondary | ICD-10-CM

## 2012-10-03 LAB — COMPREHENSIVE METABOLIC PANEL
AST: 349 U/L — ABNORMAL HIGH (ref 0–37)
Albumin: 3 g/dL — ABNORMAL LOW (ref 3.5–5.2)
Alkaline Phosphatase: 231 U/L — ABNORMAL HIGH (ref 39–117)
BUN: 3 mg/dL — ABNORMAL LOW (ref 6–23)
Chloride: 99 mEq/L (ref 96–112)
Creatinine, Ser: 0.5 mg/dL (ref 0.50–1.10)
Potassium: 3.1 mEq/L — ABNORMAL LOW (ref 3.5–5.1)
Total Protein: 6.8 g/dL (ref 6.0–8.3)

## 2012-10-03 MED ORDER — GADOBENATE DIMEGLUMINE 529 MG/ML IV SOLN
20.0000 mL | Freq: Once | INTRAVENOUS | Status: AC
  Administered 2012-10-03: 20 mL via INTRAVENOUS

## 2012-10-03 NOTE — Progress Notes (Signed)
     [facility] Daily Rounding Note 10/03/2012, 11:10 AM  SUBJECTIVE:       11/16 lap chole for symptomatic gallstones, cholecystitis.  The GB was adherent to liver and cystic duct described as friable, therefore no intraop cholangiogram was done. Her LFTs had initially been elevated. preop ultrasound showed 11mm CBD.    Transaminases 382 and 1138, alk phos 167.  They came down to145/760  preop.  They are now on the rise. Lipase was normal.   OBJECTIVE:         Vital signs in last 24 hours:    Temp:  [98.2 F (36.8 C)-99.3 F (37.4 C)] 98.8 F (37.1 C) (11/18 0534) Pulse Rate:  [80-93] 80  (11/18 0534) Resp:  [16-20] 16  (11/18 0534) BP: (99-117)/(61-68) 117/68 mmHg (11/18 0534) SpO2:  [93 %-98 %] 98 % (11/18 0534) Last BM Date:  (preop)  Lab Results:  Basename 10/02/12 0506 10/01/12 0539 09/30/12 1744  WBC 8.8 7.8 8.1  HGB 11.4* 12.6 13.5  HCT 34.8* 37.4 40.1  PLT 280 317 341   BMET  Basename 10/03/12 0500 10/02/12 0506 10/01/12 0539  NA 136 140 139  K 3.1* 3.4* 3.2*  CL 99 103 102  CO2 26 29 28   GLUCOSE 114* 102* 137*  BUN 3* 3* 6  CREATININE 0.50 0.55 0.66  CALCIUM 8.6 8.3* 8.3*   LFT  Basename 10/03/12 0500 10/02/12 0506 10/01/12 0539  PROT 6.8 6.7 6.9  ALBUMIN 3.0* 2.9* 3.2*  AST 349* 209* 145*  ALT 790* 657* 760*  ALKPHOS 231* 146* 134*  BILITOT 2.0* 0.8 1.1  BILIDIR -- -- --  IBILI -- -- --   PT/INR No results found for this basename: LABPROT:2,INR:2 in the last 72 hours Hepatitis Panel No results found for this basename: HEPBSAG,HCVAB,HEPAIGM,HEPBIGM in the last 72 hours  Studies/Results: 09/30/2012 COMPLETE ABDOMINAL ULTRASOUND  Comparison: None.  Findings:  Gallbladder: Abnormal gallbladder with multiple shadowing stones.  Wall thickness 6.2 mm. Positive Murphy's sign of pericholecystic  fluid.  Common bile duct: Increased in size measuring 11 mm extrahepatic  location.  Liver: Moderate enlargement. Increased echogenicity. No focal  defects.  No definite intrahepatic ductal dilatation.  IVC: Appears normal.  Pancreas: No focal abnormality seen.  Spleen: Normal measuring 5.7 cm.  Right Kidney: Normal measuring 12.3 cm.  Left Kidney: Overall normal in size. A cystic area in the upper  pole measures 1.0 x 1.2 x 1.3 cm.  Abdominal aorta: No aneurysm identified.  IMPRESSION:  Cholelithiasis with concern for acute cholecystitis. Dilated  common bile duct of 11 mm concerning for common bile duct stone.  Hepatomegaly.   ASSESMENT: *  Rising LFTs post lap chole.  Unable to obtain intraop cholangiogram due to friability.  *  Acute cholecystitis. Multiple stones in GB noted. GB very adherent to liver, dissected away with "some difficultly" *  Obesity.  BMI 39.4  PLAN:    LOS: 3 days   Jennye Moccasin  10/03/2012, 11:10 AM Pager: [phone]

## 2012-10-03 NOTE — Consult Note (Signed)
[facility] Consult: 11:48 AM 10/03/2012   Referring Provider: Dr Derrell Lolling.  Primary Care Physician:  none Primary Gastroenterologist:  none   Reason for Consultation:  Rising LFTs after lap  Chole on 11/16  HPI: Vicki Roberts is a 31 y.o. female.  10 or so days of abdominal pain and vomiting.  Admitted 11/15. Ultrasound showed acute cholecystitis and 11 mm CBD but no CBD stones  11/16 lap chole for symptomatic gallstones, cholecystitis. The GB was adherent to liver and cystic duct described as friable, therefore no intraop cholangiogram was done. Her LFTs had initially been elevated. preop ultrasound showed 11mm CBD. Transaminases 382 and 1138, alk phos 167. They came down to145/760 preop. They are now on the rise. Lipase was normal. Her LFTs had initially been elevated.  She is still having RUQ and back pain and has been vomiting.   Has taken TUMS  For heartburn in the past but the sxs of last many days are different from heartburn.     Past Medical History  Diagnosis Date  . Vaginal delivery x 3.      Past Surgical History  Procedure Date  . No past surgeries     Prior to Admission medications   Medication Sig Start Date End Date Taking? Authorizing Provider  acetaminophen (TYLENOL) 500 MG tablet Take 500 mg by mouth every 6 (six) hours as needed. For pain   Yes Historical Provider, MD    Scheduled Meds:    . ciprofloxacin  400 mg Intravenous BID  . [EXPIRED] influenza  inactive virus vaccine  0.5 mL Intramuscular Tomorrow-1000  . pantoprazole (PROTONIX) IV  40 mg Intravenous QHS   Infusions:    . sodium chloride 75 mL/hr at 10/03/12 0751   PRN Meds: acetaminophen, acetaminophen, HYDROmorphone (DILAUDID) injection, HYDROmorphone (DILAUDID) injection, ondansetron, ondansetron, oxyCODONE, polyethylene glycol   Allergies as of 09/30/2012  . (No Known Allergies)    History reviewed. No pertinent family  history.  History   Social History  . Marital Status: Married    Spouse Name: N/A    Number of Children: N/A  . Years of Education: N/A   Occupational History  . Homemaker.    Social History Main Topics  . Smoking status: Never Smoker   . Smokeless tobacco: Never Used  . Alcohol Use: No  . Drug Use: No  . Sexually Active: Yes   Other Topics Concern  . Not on file   Social History Narrative  . Kids are 61, 9 and 7 in 09/2012.     REVIEW OF SYSTEMS: Constitutional:  30 # weight gain post 3rd child 7 years ago. ENT:  No nose bleeds Pulm:  No sob or pleuritic pain.  No cough CV:  No chest pain GU:  Urinating a lot, urine is still dark GI:  As per HPI Heme:  No hx anemia.    Transfusions:  none Neuro:  occassional headaches, used Ibuprofen to manage, 2 x in the last month Derm:  No itching, sores or rash Endocrine:  No hx of prenatal disbetes Immunization:  Not known Travel:  None.   PHYSICAL EXAM: Vital signs in last 24 hours: Temp:  [98.2 F (36.8 C)-99.3 F (37.4 C)] 98.8 F (37.1 C) (11/18 0534) Pulse Rate:  [80-93] 80  (11/18 0534) Resp:  [16-20] 16  (11/18 0534) BP: (99-117)/(61-68) 117/68 mmHg (11/18 0534) SpO2:  [93 %-98 %] 98 % (11/18 0534)  General: obese, slightly uncomfortable hispanicwoman, non toxic Head:  No swelling or  asymmetry  Eyes:  Slight icterus.  Ears:  Not HOH  Nose:  No discharge Mouth:  Clear and moist.  Good teeth Neck:  No mass Lungs:  Clear.  No cough or trouble breathing Heart: RRR Abdomen:  Soft, obese, taped and glued incisions x 4.  Active BS.  Tender everywhere except in lower quadrants.   Rectal: not done   Musc/Skeltl: no joint deformity. Extremities:  No pedal edema  Neurologic:  Pleasant, full oriented.  Moves all 4s easily Skin:  No rash or sores.  Not obviously jaundiced but her skin is naturally olive colored Tattoos:  none Nodes:  No cervical adenopathy   Psych:  Pleasant, relaxed.   Intake/Output from  previous day: 11/17 0701 - 11/18 0700 In: 480 [P.O.:480] Out: -  Intake/Output this shift: Total I/O In: 240 [P.O.:240] Out: -   LAB RESULTS:  Basename 10/02/12 0506 10/01/12 0539 09/30/12 1744  WBC 8.8 7.8 8.1  HGB 11.4* 12.6 13.5  HCT 34.8* 37.4 40.1  PLT 280 317 341   BMET Lab Results  Component Value Date   NA 136 10/03/2012   NA 140 10/02/2012   NA 139 10/01/2012   K 3.1* 10/03/2012   K 3.4* 10/02/2012   K 3.2* 10/01/2012   CL 99 10/03/2012   CL 103 10/02/2012   CL 102 10/01/2012   CO2 26 10/03/2012   CO2 29 10/02/2012   CO2 28 10/01/2012   GLUCOSE 114* 10/03/2012   GLUCOSE 102* 10/02/2012   GLUCOSE 137* 10/01/2012   BUN 3* 10/03/2012   BUN 3* 10/02/2012   BUN 6 10/01/2012   CREATININE 0.50 10/03/2012   CREATININE 0.55 10/02/2012   CREATININE 0.66 10/01/2012   CALCIUM 8.6 10/03/2012   CALCIUM 8.3* 10/02/2012   CALCIUM 8.3* 10/01/2012   LFT  Basename 10/03/12 0500 10/02/12 0506 10/01/12 0539  PROT 6.8 6.7 6.9  ALBUMIN 3.0* 2.9* 3.2*  AST 349* 209* 145*  ALT 790* 657* 760*  ALKPHOS 231* 146* 134*  BILITOT 2.0* 0.8 1.1  BILIDIR -- -- --  IBILI -- -- --   PT/INR No results found for this basename: INR,  PROTIME    RADIOLOGY STUDIES: 09/30/2012 COMPLETE ABDOMINAL ULTRASOUND  Comparison: None.  Findings:  Gallbladder: Abnormal gallbladder with multiple shadowing stones.  Wall thickness 6.2 mm. Positive Murphy's sign of pericholecystic  fluid.  Common bile duct: Increased in size measuring 11 mm extrahepatic  location.  Liver: Moderate enlargement. Increased echogenicity. No focal  defects. No definite intrahepatic ductal dilatation.  IVC: Appears normal.  Pancreas: No focal abnormality seen.  Spleen: Normal measuring 5.7 cm.  Right Kidney: Normal measuring 12.3 cm.  Left Kidney: Overall normal in size. A cystic area in the upper  pole measures 1.0 x 1.2 x 1.3 cm.  Abdominal aorta: No aneurysm identified.  IMPRESSION:  Cholelithiasis with  concern for acute cholecystitis. Dilated  common bile duct of 11 mm concerning for common bile duct stone.  Hepatomegaly.     ENDOSCOPIC STUDIES: None ever  IMPRESSION:   * Rising LFTs post lap chole. Unable to obtain intraop cholangiogram due to friability.  * Acute cholecystitis. Multiple stones in GB noted. GB very adherent to liver, dissected away with "some difficultly"  * Obesity. BMI 39.4  PLAN: *  Case d/w Dr Christella Hartigan:  MRI/MRCP   LOS: 3 days   Jennye Moccasin  10/03/2012, 11:48 AM Pager: [phone]      ________________________________________________________________________  [facility] MD note:  I personally examined the patient,  reviewed the data and agree with the assessment and plan described above.  Somewhat difficult lap chole 2 days ago and LFTs rising.  Retained biliary stone?  Leak?  Perhaps just post op edema, inflammation?  MRI with MRCP ordered should help with this question.  She will require ERCP if leak or retained stone confirmed.  Will follow with you.   Rob Bunting, MD [facility] Pager [phone]

## 2012-10-03 NOTE — Progress Notes (Signed)
2 Days Post-Op  Subjective: Nausea and vomiting with breakfast, pancakes, incisions OK still having pain RUQ She had breakfast at 8 AM. Objective: Vital signs in last 24 hours: Temp:  [98.2 F (36.8 C)-99.3 F (37.4 C)] 98.8 F (37.1 C) (11/18 0534) Pulse Rate:  [80-93] 80  (11/18 0534) Resp:  [16-20] 16  (11/18 0534) BP: (99-117)/(61-68) 117/68 mmHg (11/18 0534) SpO2:  [93 %-98 %] 98 % (11/18 0534) Last BM Date:  (preop)  480 PO recorded, Diet: fat modified. Afebrile, VSS, LFT's going up, cholangiogram  Intake/Output from previous day: 11/17 0701 - 11/18 0700 In: 480 [P.O.:480] Out: -  Intake/Output this shift:    General appearance: alert, cooperative, no distress and clearly feels bad. GI: soft, tender, mostly in RUQ, incisions look good.  Lab Results:   [facility] 10/02/12 0506 10/01/12 0539  WBC 8.8 7.8  HGB 11.4* 12.6  HCT 34.8* 37.4  PLT 280 317    BMET  Basename 10/03/12 0500 10/02/12 0506  NA 136 140  K 3.1* 3.4*  CL 99 103  CO2 26 29  GLUCOSE 114* 102*  BUN 3* 3*  CREATININE 0.50 0.55  CALCIUM 8.6 8.3*   PT/INR No results found for this basename: LABPROT:2,INR:2 in the last 72 hours   Lab 10/03/12 0500 10/02/12 0506 10/01/12 0539 09/30/12 1744  AST 349* 209* 145* 382*  ALT 790* 657* 760* 1138*  ALKPHOS 231* 146* 134* 167*  BILITOT 2.0* 0.8 1.1 2.2*  PROT 6.8 6.7 6.9 8.3  ALBUMIN 3.0* 2.9* 3.2* 4.0     Lipase     Component Value Date/Time   LIPASE 27 09/30/2012 1744     Studies/Results: No results found.  Medications:    . ciprofloxacin  400 mg Intravenous BID  . [EXPIRED] influenza  inactive virus vaccine  0.5 mL Intramuscular Tomorrow-1000  . pantoprazole (PROTONIX) IV  40 mg Intravenous QHS    Assessment/Plan Acute cholecystitis s/p: LAPAROSCOPIC CHOLECYSTECTOMY WITH NO INTRAOPERATIVE CHOLANGIOGRAM, 10/01/12, MW Body mass index is 39.32  Plan:  I have talked to GI, Dr.Perry left a note on 11/16.  I plan to make her NPO, and  told her thru family member that GI will see her to consider ERCP. Repeat labs in AM.    LOS: 3 days    JENNINGS,WILLARD 10/03/2012  Dr. Dwain Sarna unable to do cholangiogram.  Now with persistently elevated LVT's.  Agree with plans to consider ERCP. Husband at bedside.  Limited English language skills, but they seem to understand plan.  Ovidio Kin, MD, [facility] Pager: [phone] Office phone:  [phone]

## 2012-10-04 ENCOUNTER — Encounter (HOSPITAL_COMMUNITY): Payer: Self-pay | Admitting: General Practice

## 2012-10-04 DIAGNOSIS — K8050 Calculus of bile duct without cholangitis or cholecystitis without obstruction: Secondary | ICD-10-CM

## 2012-10-04 LAB — COMPREHENSIVE METABOLIC PANEL
ALT: 588 U/L — ABNORMAL HIGH (ref 0–35)
Albumin: 2.9 g/dL — ABNORMAL LOW (ref 3.5–5.2)
Alkaline Phosphatase: 256 U/L — ABNORMAL HIGH (ref 39–117)
Glucose, Bld: 103 mg/dL — ABNORMAL HIGH (ref 70–99)
Potassium: 3 mEq/L — ABNORMAL LOW (ref 3.5–5.1)
Sodium: 137 mEq/L (ref 135–145)
Total Protein: 7 g/dL (ref 6.0–8.3)

## 2012-10-04 LAB — CBC
Hemoglobin: 11.8 g/dL — ABNORMAL LOW (ref 12.0–15.0)
MCHC: 33.8 g/dL (ref 30.0–36.0)
RDW: 13.8 % (ref 11.5–15.5)

## 2012-10-04 MED ORDER — HEPARIN SODIUM (PORCINE) 5000 UNIT/ML IJ SOLN
5000.0000 [IU] | Freq: Three times a day (TID) | INTRAMUSCULAR | Status: AC
  Administered 2012-10-04 (×2): 5000 [IU] via SUBCUTANEOUS
  Filled 2012-10-04 (×3): qty 1

## 2012-10-04 MED ORDER — INFLUENZA VIRUS VACC SPLIT PF IM SUSP
0.5000 mL | INTRAMUSCULAR | Status: AC
  Filled 2012-10-04: qty 0.5

## 2012-10-04 MED ORDER — POTASSIUM CHLORIDE CRYS ER 20 MEQ PO TBCR
30.0000 meq | EXTENDED_RELEASE_TABLET | Freq: Three times a day (TID) | ORAL | Status: DC
  Administered 2012-10-04 – 2012-10-06 (×6): 30 meq via ORAL
  Filled 2012-10-04 (×9): qty 1

## 2012-10-04 MED ORDER — POTASSIUM CHLORIDE IN NACL 40-0.9 MEQ/L-% IV SOLN
INTRAVENOUS | Status: DC
  Administered 2012-10-04: 13:00:00 via INTRAVENOUS
  Filled 2012-10-04 (×5): qty 1000

## 2012-10-04 NOTE — Progress Notes (Deleted)
     [facility] Daily Rounding Note 10/04/2012, 8:18 AM  SUBJECTIVE:       Pain in RUQ is much better.  No n/v.  Hungry.  OBJECTIVE:         Vital signs in last 24 hours:    Temp:  [97.9 F (36.6 C)-99 F (37.2 C)] 98.5 F (36.9 C) (11/19 0548) Pulse Rate:  [73-80] 75  (11/19 0548) Resp:  [18-20] 20  (11/19 0548) BP: (95-114)/(58-69) 114/69 mmHg (11/19 0548) SpO2:  [95 %-98 %] 95 % (11/19 0548) Last BM Date:  (preop) General: looks well   Heart: rrr Chest: clear.  No resp distress Abdomen: soft, obese, ND, active BS.  Slightly tender epigastrum and RUQ  Extremities: no pedal edema Neuro/Psych:  Fully alert and oriented.  Relaxed. Cooperative.   Intake/Output from previous day: 11/18 0701 - 11/19 0700 In: 2140 [P.O.:240; I.V.:1900] Out: -   Intake/Output this shift:    Lab Results:  Basename 10/04/12 0650 10/02/12 0506  WBC 9.1 8.8  HGB 11.8* 11.4*  HCT 34.9* 34.8*  PLT 292 280   BMET  Basename 10/03/12 0500 10/02/12 0506  NA 136 140  K 3.1* 3.4*  CL 99 103  CO2 26 29  GLUCOSE 114* 102*  BUN 3* 3*  CREATININE 0.50 0.55  CALCIUM 8.6 8.3*   LFT  Basename 10/03/12 0500 10/02/12 0506  PROT 6.8 6.7  ALBUMIN 3.0* 2.9*  AST 349* 209*  ALT 790* 657*  ALKPHOS 231* 146*  BILITOT 2.0* 0.8  BILIDIR -- --  IBILI -- --    Studies/Results: MRI/MRCP not done yet.  ASSESMENT: * Persistent rise LFTs and ongoing abd pain post lap chole. Unable to obtain intraop cholangiogram due to friability.  CBD dilated to 11mm preop Rule out post op bile leak vs CBD stone vs persistent edema causing CBD obstruction  * Acute cholecystitis. Multiple stones in GB noted. GB very adherent to liver, dissected away with "some difficultly"  * Obesity. BMI 39.4   PLAN: *  Ercp tomorrow in or at 1330.  Ok to have clears today.  discuseed risks benefits with pt asn spouse using phone translation.  All questions answered.    LOS: 4 days   Jennye Moccasin  10/04/2012, 8:18 AM Pager:  [phone]

## 2012-10-04 NOTE — Progress Notes (Signed)
ERCP tomorrow, then possibly home.  Vicki Roberts. Gae Bon, MD, FACS [phone] [facility]

## 2012-10-04 NOTE — Progress Notes (Signed)
Interpreter Wyvonnia Dusky for Wandra Mannan Admiting

## 2012-10-04 NOTE — Progress Notes (Signed)
[facility] Progress Note    Since last GI note: MRCP confirms retained CBD stones. Planning on ERCP tomorrow in OR at [facility].  Objective: Vital signs in last 24 hours: Temp:  [97.9 F (36.6 C)-99 F (37.2 C)] 98.5 F (36.9 C) (11/19 0548) Pulse Rate:  [73-80] 75  (11/19 0548) Resp:  [18-20] 20  (11/19 0548) BP: (95-114)/(58-69) 114/69 mmHg (11/19 0548) SpO2:  [95 %-98 %] 95 % (11/19 0548) Last BM Date:  (preop) General: alert and oriented times 3 Heart: regular rate and rythm Abdomen: soft, non-tender, non-distended, normal bowel sounds   Lab Results:  Basename 10/04/12 0650 10/02/12 0506  WBC 9.1 8.8  HGB 11.8* 11.4*  PLT 292 280  MCV 86.2 87.0    Basename 10/04/12 0650 10/03/12 0500 10/02/12 0506  NA 137 136 140  K 3.0* 3.1* 3.4*  CL 100 99 103  CO2 26 26 29  GLUCOSE 103* 114* 102*  BUN 4* 3* 3*  CREATININE 0.50 0.50 0.55  CALCIUM 8.6 8.6 8.3*    Basename 10/04/12 0650 10/03/12 0500 10/02/12 0506  PROT 7.0 6.8 6.7  ALBUMIN 2.9* 3.0* 2.9*  AST 156* 349* 209*  ALT 588* 790* 657*  ALKPHOS 256* 231* 146*  BILITOT 1.2 2.0* 0.8  BILIDIR -- -- --  IBILI -- -- --   No results found for this basename: INR:2 in the last 72 hours   Studies/Results: Mr 3d Recon At Scanner  10/04/2012  *RADIOLOGY REPORT*  Clinical Data:  Abdominal pain and vomiting.  Laparoscopic cholecystectomy on 11/16 for symptomatic gallstones and cholecystitis. Intraoperative cholangiogram cannot be performed due to friable gallbladder.  Elevated transaminases.  Right upper quadrant abdominal pain.  MRI ABDOMEN WITHOUT AND WITH CONTRAST (MRCP)  Technique:  Multiplanar multisequence MR imaging of the abdomen was performed without and with contrast, including heavily T2-weighted images of the biliary and pancreatic ducts.  Three-dimensional MR images were rendered by post processing of the original MR data.  Contrast: 20mL MULTIHANCE GADOBENATE DIMEGLUMINE 529 MG/ML IV SOLN  Comparison:   09/30/2012 ultrasound  Findings:  Atelectasis observed in both lung bases.  Mild intrahepatic biliary dilatation noted.  Postoperative fluid and edema noted in the gallbladder fossa.  A blind ending structure believed to represent cystic duct remnant attaches to the common duct very low in position, at the level of the pancreatic head.  There is a 4 mm filling defect within the distal portion of this cystic duct remnant, along with a 6 mm filling defect in the distal common bile duct just proximal to the ampule left; both of these are visible on images 22-24 of series 5. The common hepatic duct is dilated at 1.3 cm.  No dorsal pancreatic duct dilatation observed.  Peripancreatic lymph nodes measure up to 10 mm in short axis, and are probably reactive.  10 mm Bosniak category one cyst of the left kidney upper pole observed.  Pancreas and adrenal glands unremarkable.  Spleen appears normal.  Diffuse signal drop out on out-of-phase images in the liver is compatible with hepatic steatosis.  IMPRESSION:  1. Filling defects in the distal common bile duct and distal portion of the cystic duct remnant are compatible with small gallstones, and are associated with dilated common hepatic duct at 1.3 cm.  Please note that the cystic duct remnant attaches to the common duct very low in position, in the vicinity of the pancreatic head. 2.  Fluid and postoperative findings along the gallbladder fossa. Borderline enlarged adjacent reactive lymph nodes.   3.  Very minimal intrahepatic biliary dilatation. 4.  Atelectasis in both lower lobes.  5.  Bosniak category one cyst of the left kidney upper pole. 6.  Hepatic steatosis.   Original Report Authenticated By: Walter Liebkemann, M.D.    Mr Abd W/wo Cm/mrcp  10/04/2012  *RADIOLOGY REPORT*  Clinical Data:  Abdominal pain and vomiting.  Laparoscopic cholecystectomy on 11/16 for symptomatic gallstones and cholecystitis. Intraoperative cholangiogram cannot be performed due to friable  gallbladder.  Elevated transaminases.  Right upper quadrant abdominal pain.  MRI ABDOMEN WITHOUT AND WITH CONTRAST (MRCP)  Technique:  Multiplanar multisequence MR imaging of the abdomen was performed without and with contrast, including heavily T2-weighted images of the biliary and pancreatic ducts.  Three-dimensional MR images were rendered by post processing of the original MR data.  Contrast: 20mL MULTIHANCE GADOBENATE DIMEGLUMINE 529 MG/ML IV SOLN  Comparison:  09/30/2012 ultrasound  Findings:  Atelectasis observed in both lung bases.  Mild intrahepatic biliary dilatation noted.  Postoperative fluid and edema noted in the gallbladder fossa.  A blind ending structure believed to represent cystic duct remnant attaches to the common duct very low in position, at the level of the pancreatic head.  There is a 4 mm filling defect within the distal portion of this cystic duct remnant, along with a 6 mm filling defect in the distal common bile duct just proximal to the ampule left; both of these are visible on images 22-24 of series 5. The common hepatic duct is dilated at 1.3 cm.  No dorsal pancreatic duct dilatation observed.  Peripancreatic lymph nodes measure up to 10 mm in short axis, and are probably reactive.  10 mm Bosniak category one cyst of the left kidney upper pole observed.  Pancreas and adrenal glands unremarkable.  Spleen appears normal.  Diffuse signal drop out on out-of-phase images in the liver is compatible with hepatic steatosis.  IMPRESSION:  1. Filling defects in the distal common bile duct and distal portion of the cystic duct remnant are compatible with small gallstones, and are associated with dilated common hepatic duct at 1.3 cm.  Please note that the cystic duct remnant attaches to the common duct very low in position, in the vicinity of the pancreatic head. 2.  Fluid and postoperative findings along the gallbladder fossa. Borderline enlarged adjacent reactive lymph nodes. 3.  Very minimal  intrahepatic biliary dilatation. 4.  Atelectasis in both lower lobes.  5.  Bosniak category one cyst of the left kidney upper pole. 6.  Hepatic steatosis.   Original Report Authenticated By: Walter Liebkemann, M.D.      Medications: Scheduled Meds:   . ciprofloxacin  400 mg Intravenous BID  . [COMPLETED] gadobenate dimeglumine  20 mL Intravenous Once  . heparin subcutaneous  5,000 Units Subcutaneous Q8H  . influenza  inactive virus vaccine  0.5 mL Intramuscular Tomorrow-1000  . pantoprazole (PROTONIX) IV  40 mg Intravenous QHS  . potassium chloride  30 mEq Oral TID WC   Continuous Infusions:   . 0.9 % NaCl with KCl 40 mEq / L    . [DISCONTINUED] sodium chloride 75 mL/hr at 10/04/12 0127   PRN Meds:.acetaminophen, acetaminophen, HYDROmorphone (DILAUDID) injection, HYDROmorphone (DILAUDID) injection, ondansetron, ondansetron, oxyCODONE, polyethylene glycol    Assessment/Plan: 31 y.o. female with gallstone disease, retained CBD stones after lap chole  ERCP tomorrow, scheduled for 1:30pm in OR here at [facility].  Agree with letting her eat today, then NPO after MN    Ranyia Witting, MD  10/04/2012, 10:53 AM   [facility] Pager [phone]     

## 2012-10-04 NOTE — Progress Notes (Signed)
3 Days Post-Op  Subjective: Still having pain RUQ, no nausea, no vomiting now.  She says she's hungry too.  Objective: Vital signs in last 24 hours: Temp:  [97.9 F (36.6 C)-99 F (37.2 C)] 98.5 F (36.9 C) (11/19 0548) Pulse Rate:  [73-80] 75  (11/19 0548) Resp:  [18-20] 20  (11/19 0548) BP: (95-114)/(58-69) 114/69 mmHg (11/19 0548) SpO2:  [95 %-98 %] 95 % (11/19 0548) Last BM Date:  (preop)  NPO, 240 PO reported yesterday. BP 95-118 range, afebrile, Labs and MRCP pending  Intake/Output from previous day: 11/18 0701 - 11/19 0700 In: 2140 [P.O.:240; I.V.:1900] Out: -  Intake/Output this shift:    General appearance: alert, cooperative and no distress GI: soft, still having pain RUQ, but no distension, +BS, incisions look good.  Lab Results:   [facility] 10/04/12 0650 10/02/12 0506  WBC 9.1 8.8  HGB 11.8* 11.4*  HCT 34.9* 34.8*  PLT 292 280    BMET  Basename 10/04/12 0650 10/03/12 0500  NA 137 136  K 3.0* 3.1*  CL 100 99  CO2 26 26  GLUCOSE 103* 114*  BUN 4* 3*  CREATININE 0.50 0.50  CALCIUM 8.6 8.6   PT/INR No results found for this basename: LABPROT:2,INR:2 in the last 72 hours   Lab 10/04/12 0650 10/03/12 0500 10/02/12 0506 10/01/12 0539 09/30/12 1744  AST 156* 349* 209* 145* 382*  ALT 588* 790* 657* 760* 1138*  ALKPHOS 256* 231* 146* 134* 167*  BILITOT 1.2 2.0* 0.8 1.1 2.2*  PROT 7.0 6.8 6.7 6.9 8.3  ALBUMIN 2.9* 3.0* 2.9* 3.2* 4.0     Lipase     Component Value Date/Time   LIPASE 160* 10/04/2012 0650     Studies/Results: Mr 3d Recon At Scanner  10/04/2012  *RADIOLOGY REPORT*  Clinical Data:  Abdominal pain and vomiting.  Laparoscopic cholecystectomy on 11/16 for symptomatic gallstones and cholecystitis. Intraoperative cholangiogram cannot be performed due to friable gallbladder.  Elevated transaminases.  Right upper quadrant abdominal pain.  MRI ABDOMEN WITHOUT AND WITH CONTRAST (MRCP)  Technique:  Multiplanar multisequence MR imaging of the  abdomen was performed without and with contrast, including heavily T2-weighted images of the biliary and pancreatic ducts.  Three-dimensional MR images were rendered by post processing of the original MR data.  Contrast: 20mL MULTIHANCE GADOBENATE DIMEGLUMINE 529 MG/ML IV SOLN  Comparison:  09/30/2012 ultrasound  Findings:  Atelectasis observed in both lung bases.  Mild intrahepatic biliary dilatation noted.  Postoperative fluid and edema noted in the gallbladder fossa.  A blind ending structure believed to represent cystic duct remnant attaches to the common duct very low in position, at the level of the pancreatic head.  There is a 4 mm filling defect within the distal portion of this cystic duct remnant, along with a 6 mm filling defect in the distal common bile duct just proximal to the ampule left; both of these are visible on images 22-24 of series 5. The common hepatic duct is dilated at 1.3 cm.  No dorsal pancreatic duct dilatation observed.  Peripancreatic lymph nodes measure up to 10 mm in short axis, and are probably reactive.  10 mm Bosniak category one cyst of the left kidney upper pole observed.  Pancreas and adrenal glands unremarkable.  Spleen appears normal.  Diffuse signal drop out on out-of-phase images in the liver is compatible with hepatic steatosis.  IMPRESSION:  1. Filling defects in the distal common bile duct and distal portion of the cystic duct remnant are compatible with  small gallstones, and are associated with dilated common hepatic duct at 1.3 cm.  Please note that the cystic duct remnant attaches to the common duct very low in position, in the vicinity of the pancreatic head. 2.  Fluid and postoperative findings along the gallbladder fossa. Borderline enlarged adjacent reactive lymph nodes. 3.  Very minimal intrahepatic biliary dilatation. 4.  Atelectasis in both lower lobes.  5.  Bosniak category one cyst of the left kidney upper pole. 6.  Hepatic steatosis.   Original Report  Authenticated By: Gaylyn Rong, M.D.    Mr Abd W/wo Cm/mrcp  10/04/2012  *RADIOLOGY REPORT*  Clinical Data:  Abdominal pain and vomiting.  Laparoscopic cholecystectomy on 11/16 for symptomatic gallstones and cholecystitis. Intraoperative cholangiogram cannot be performed due to friable gallbladder.  Elevated transaminases.  Right upper quadrant abdominal pain.  MRI ABDOMEN WITHOUT AND WITH CONTRAST (MRCP)  Technique:  Multiplanar multisequence MR imaging of the abdomen was performed without and with contrast, including heavily T2-weighted images of the biliary and pancreatic ducts.  Three-dimensional MR images were rendered by post processing of the original MR data.  Contrast: 20mL MULTIHANCE GADOBENATE DIMEGLUMINE 529 MG/ML IV SOLN  Comparison:  09/30/2012 ultrasound  Findings:  Atelectasis observed in both lung bases.  Mild intrahepatic biliary dilatation noted.  Postoperative fluid and edema noted in the gallbladder fossa.  A blind ending structure believed to represent cystic duct remnant attaches to the common duct very low in position, at the level of the pancreatic head.  There is a 4 mm filling defect within the distal portion of this cystic duct remnant, along with a 6 mm filling defect in the distal common bile duct just proximal to the ampule left; both of these are visible on images 22-24 of series 5. The common hepatic duct is dilated at 1.3 cm.  No dorsal pancreatic duct dilatation observed.  Peripancreatic lymph nodes measure up to 10 mm in short axis, and are probably reactive.  10 mm Bosniak category one cyst of the left kidney upper pole observed.  Pancreas and adrenal glands unremarkable.  Spleen appears normal.  Diffuse signal drop out on out-of-phase images in the liver is compatible with hepatic steatosis.  IMPRESSION:  1. Filling defects in the distal common bile duct and distal portion of the cystic duct remnant are compatible with small gallstones, and are associated with dilated  common hepatic duct at 1.3 cm.  Please note that the cystic duct remnant attaches to the common duct very low in position, in the vicinity of the pancreatic head. 2.  Fluid and postoperative findings along the gallbladder fossa. Borderline enlarged adjacent reactive lymph nodes. 3.  Very minimal intrahepatic biliary dilatation. 4.  Atelectasis in both lower lobes.  5.  Bosniak category one cyst of the left kidney upper pole. 6.  Hepatic steatosis.   Original Report Authenticated By: Gaylyn Rong, M.D.     Medications:    . ciprofloxacin  400 mg Intravenous BID  . [COMPLETED] gadobenate dimeglumine  20 mL Intravenous Once  . heparin subcutaneous  5,000 Units Subcutaneous Q8H  . [EXPIRED] influenza  inactive virus vaccine  0.5 mL Intramuscular Tomorrow-1000  . pantoprazole (PROTONIX) IV  40 mg Intravenous QHS    Assessment/Plan Acute cholecystitis s/p: LAPAROSCOPIC CHOLECYSTECTOMY WITH NO INTRAOPERATIVE CHOLANGIOGRAM, 10/01/12, MW  Rising LFT's Body mass index is 39.32   Plan:  Await GI and MRCP/lab results.  Hopefully ERCP later today, and then we can feed her. Recheck labs AM, I will make sure  she's on DVT prophylaxis.  Discussed with GI, they plan ERCP tomorrow.  I will put her on clears today. MRCP shows filling defects, in CBD and distal portion of remaninig cystic duct.  LOS: 4 days    Vicki Roberts 10/04/2012

## 2012-10-05 ENCOUNTER — Encounter (HOSPITAL_COMMUNITY): Payer: Self-pay | Admitting: Anesthesiology

## 2012-10-05 ENCOUNTER — Encounter (HOSPITAL_COMMUNITY): Admission: EM | Disposition: A | Discharge status: Home / Self Care | Payer: Self-pay | Source: Home / Self Care

## 2012-10-05 ENCOUNTER — Encounter (HOSPITAL_COMMUNITY): Payer: Self-pay | Admitting: Gastroenterology

## 2012-10-05 ENCOUNTER — Inpatient Hospital Stay (HOSPITAL_COMMUNITY): Payer: Medicaid Other

## 2012-10-05 ENCOUNTER — Inpatient Hospital Stay (HOSPITAL_COMMUNITY): Payer: Medicaid Other | Admitting: Anesthesiology

## 2012-10-05 ENCOUNTER — Encounter (HOSPITAL_COMMUNITY): Payer: Self-pay | Admitting: *Deleted

## 2012-10-05 HISTORY — PX: ERCP: SHX5425

## 2012-10-05 LAB — COMPREHENSIVE METABOLIC PANEL
CO2: 25 mEq/L (ref 19–32)
Calcium: 9 mg/dL (ref 8.4–10.5)
Creatinine, Ser: 0.52 mg/dL (ref 0.50–1.10)
GFR calc Af Amer: >90 mL/min (ref >90)
GFR calc non Af Amer: >90 mL/min (ref >90)
Glucose, Bld: 102 mg/dL — ABNORMAL HIGH (ref 70–99)

## 2012-10-05 LAB — CBC
HCT: 36.5 % (ref 36.0–46.0)
Hemoglobin: 12.2 g/dL (ref 12.0–15.0)
MCH: 28.6 pg (ref 26.0–34.0)
MCV: 85.7 fL (ref 78.0–100.0)
RBC: 4.26 MIL/uL (ref 3.87–5.11)

## 2012-10-05 SURGERY — ERCP, WITH INTERVENTION IF INDICATED
Anesthesia: Monitor Anesthesia Care

## 2012-10-05 MED ORDER — SODIUM CHLORIDE 0.9 % IV SOLN
INTRAVENOUS | Status: DC | PRN
  Administered 2012-10-05: 14:00:00

## 2012-10-05 MED ORDER — NEOSTIGMINE METHYLSULFATE 1 MG/ML IJ SOLN
INTRAMUSCULAR | Status: DC | PRN
  Administered 2012-10-05: 5 mg via INTRAVENOUS

## 2012-10-05 MED ORDER — LIDOCAINE HCL (CARDIAC) 20 MG/ML IV SOLN
INTRAVENOUS | Status: DC | PRN
  Administered 2012-10-05: 100 mg via INTRAVENOUS

## 2012-10-05 MED ORDER — ROCURONIUM BROMIDE 100 MG/10ML IV SOLN: INTRAVENOUS | Status: DC | PRN

## 2012-10-05 MED ORDER — HYDROMORPHONE HCL PF 1 MG/ML IJ SOLN: 0.2500 mg | INTRAMUSCULAR | Status: DC | PRN

## 2012-10-05 MED ORDER — ONDANSETRON HCL 4 MG/2ML IJ SOLN
INTRAMUSCULAR | Status: DC | PRN
  Administered 2012-10-05: 4 mg via INTRAVENOUS

## 2012-10-05 MED ORDER — OXYCODONE HCL 5 MG PO TABS: 5.0000 mg | ORAL_TABLET | Freq: Once | ORAL | Status: AC | PRN

## 2012-10-05 MED ORDER — METOCLOPRAMIDE HCL 5 MG/ML IJ SOLN: 10.0000 mg | Freq: Once | INTRAMUSCULAR | Status: AC | PRN

## 2012-10-05 MED ORDER — LACTATED RINGERS IV SOLN
INTRAVENOUS | Status: DC
  Administered 2012-10-05 (×2): via INTRAVENOUS

## 2012-10-05 MED ORDER — GLYCOPYRROLATE 0.2 MG/ML IJ SOLN
INTRAMUSCULAR | Status: DC | PRN
  Administered 2012-10-05: .6 mg via INTRAVENOUS

## 2012-10-05 MED ORDER — LACTATED RINGERS IV SOLN
INTRAVENOUS | Status: DC | PRN
  Administered 2012-10-05 (×2): via INTRAVENOUS

## 2012-10-05 MED ORDER — PROPOFOL 10 MG/ML IV BOLUS
INTRAVENOUS | Status: DC | PRN
  Administered 2012-10-05: 200 mg via INTRAVENOUS

## 2012-10-05 MED ORDER — OXYCODONE HCL 5 MG/5ML PO SOLN: 5.0000 mg | Freq: Once | ORAL | Status: AC | PRN

## 2012-10-05 MED ORDER — FENTANYL CITRATE 0.05 MG/ML IJ SOLN
INTRAMUSCULAR | Status: DC | PRN
  Administered 2012-10-05: 100 ug via INTRAVENOUS
  Administered 2012-10-05 (×2): 50 ug via INTRAVENOUS

## 2012-10-05 MED ORDER — ARTIFICIAL TEARS OP OINT
TOPICAL_OINTMENT | OPHTHALMIC | Status: DC | PRN
  Administered 2012-10-05: 1 via OPHTHALMIC

## 2012-10-05 MED ORDER — SODIUM CHLORIDE 0.9 % IV SOLN: INTRAVENOUS | Status: DC

## 2012-10-05 MED ORDER — MIDAZOLAM HCL 5 MG/5ML IJ SOLN
INTRAMUSCULAR | Status: DC | PRN
  Administered 2012-10-05: 2 mg via INTRAVENOUS

## 2012-10-05 MED ORDER — ROCURONIUM BROMIDE 100 MG/10ML IV SOLN
INTRAVENOUS | Status: DC | PRN
  Administered 2012-10-05: 40 mg via INTRAVENOUS

## 2012-10-05 NOTE — Op Note (Signed)
[facility] [address] [place], [address]   ERCP PROCEDURE REPORT  PATIENT: Vicki Roberts.  MR# :[id number] BIRTHDATE: 08/01/1981  GENDER: Female ENDOSCOPIST: Rachael Fee, MD PROCEDURE DATE:  10/05/2012 PROCEDURE:   ERCP with sphincterotomy/papillotomy and ERCP with removal of calculus/calculi ASA CLASS:   Class II INDICATIONS:recent lap chole (unable to perform IOC), post op MRCP showed stones in CBD and maybe in cystic duct remnant. MEDICATIONS: General endotracheal anesthesia (GETA) TOPICAL ANESTHETIC: Cetacaine Spray  DESCRIPTION OF PROCEDURE:   After the risks benefits and alternatives of the procedure were thoroughly explained, informed consent was obtained.  The [id number])  endoscope was introduced through the mouth  and advanced to the second portion of the duodenum without detailed examination of the UGI tract.  The major papilla was normal appearing.  A 44 Autotome over a .035 hydrawire was used to cannulate the CBD and contast was injected. Cholangiogram showed diffusely dilated CBD (up to 12mm) containing 2-3 small round, mobile filling defects. Cystic duct remnant filled with dye as well. There was no biliary leak. There may have been a small filling defect in cystic duct. An adequate biliary sphincterotomy was performed over the wire and then the CBD was swept several times with biliary balloon. Three small, white, smooth stones were delivered into the duodenum. The cystic duct was then cannulated over wire and swept with balloon as well.  No clear stones were noted.  A completion, occlusion cholangiogram was performed and showed no persistent biliary stones.  The main pancreatic duct was never cannulated or inejected with dye. The scope was then completely withdrawn from the patient and the procedure terminated.     COMPLICATIONS: There were no complications.  ENDOSCOPIC IMPRESSION: CBD stones, removed with  biliary sphincterotomy and balloon sweeping.  RECOMMENDATIONS: Observe overnight and likely d/c tomorrow.    _______________________________ eSignedRachael Fee, MD 10/05/2012 2:38 PM

## 2012-10-05 NOTE — OR Nursing (Signed)
10/05/2012 - Positioning note - patient turned prone from stretcher to OR Table. Turned onto prone cushions with axillary rolls used, arms extended upward on armboards with straps across arms. Knees on foam, legs bent and supported on two pillows. Safety strap across upper thighs. Face on prone cushion per anesthesia. Bair hugger applied.

## 2012-10-05 NOTE — Transfer of Care (Signed)
Immediate Anesthesia Transfer of Care Note  Patient: Vicki Roberts  Procedure(s) Performed: Procedure(s) (LRB) with comments: ENDOSCOPIC RETROGRADE CHOLANGIOPANCREATOGRAPHY (ERCP) (N/A)  Patient Location: PACU  Anesthesia Type:General  Level of Consciousness: sedated and patient cooperative  Airway & Oxygen Therapy: Patient Spontanous Breathing and Patient connected to face mask  Post-op Assessment: Report given to PACU RN, Post -op Vital signs reviewed and stable and Patient moving all extremities X 4  Post vital signs: Reviewed and stable  Complications: No apparent anesthesia complications

## 2012-10-05 NOTE — Interval H&P Note (Signed)
History and Physical Interval Note:  10/05/2012 12:51 PM  Vicki Roberts  has presented today for surgery, with the diagnosis of stones in CBD  The various methods of treatment have been discussed with the patient and family. After consideration of risks, benefits and other options for treatment, the patient has consented to  Procedure(s) (LRB) with comments: ENDOSCOPIC RETROGRADE CHOLANGIOPANCREATOGRAPHY (ERCP) (N/A) as a surgical intervention .  The patient's history has been reviewed, patient examined, no change in status, stable for surgery.  I have reviewed the patient's chart and labs.  Questions were answered to the patient's satisfaction.     Rob Bunting

## 2012-10-05 NOTE — Preoperative (Signed)
Beta Blockers   Reason not to administer Beta Blockers:Not Applicable 

## 2012-10-05 NOTE — Progress Notes (Signed)
Patient seen and examined.  Agree with PA's note. For ERCP today.

## 2012-10-05 NOTE — H&P (View-Only) (Signed)
[facility] Progress Note    Since last GI note: MRCP confirms retained CBD stones. Planning on ERCP tomorrow in OR at [facility].  Objective: Vital signs in last 24 hours: Temp:  [97.9 F (36.6 C)-99 F (37.2 C)] 98.5 F (36.9 C) (11/19 0548) Pulse Rate:  [73-80] 75  (11/19 0548) Resp:  [18-20] 20  (11/19 0548) BP: (95-114)/(58-69) 114/69 mmHg (11/19 0548) SpO2:  [95 %-98 %] 95 % (11/19 0548) Last BM Date:  (preop) General: alert and oriented times 3 Heart: regular rate and rythm Abdomen: soft, non-tender, non-distended, normal bowel sounds   Lab Results:  [facility] 10/04/12 0650 10/02/12 0506  WBC 9.1 8.8  HGB 11.8* 11.4*  PLT 292 280  MCV 86.2 87.0    Basename 10/04/12 0650 10/03/12 0500 10/02/12 0506  NA 137 136 140  K 3.0* 3.1* 3.4*  CL 100 99 103  CO2 26 26 29   GLUCOSE 103* 114* 102*  BUN 4* 3* 3*  CREATININE 0.50 0.50 0.55  CALCIUM 8.6 8.6 8.3*    Basename 10/04/12 0650 10/03/12 0500 10/02/12 0506  PROT 7.0 6.8 6.7  ALBUMIN 2.9* 3.0* 2.9*  AST 156* 349* 209*  ALT 588* 790* 657*  ALKPHOS 256* 231* 146*  BILITOT 1.2 2.0* 0.8  BILIDIR -- -- --  IBILI -- -- --   No results found for this basename: INR:2 in the last 72 hours   Studies/Results: Mr 3d Recon At Scanner  10/04/2012  *RADIOLOGY REPORT*  Clinical Data:  Abdominal pain and vomiting.  Laparoscopic cholecystectomy on 11/16 for symptomatic gallstones and cholecystitis. Intraoperative cholangiogram cannot be performed due to friable gallbladder.  Elevated transaminases.  Right upper quadrant abdominal pain.  MRI ABDOMEN WITHOUT AND WITH CONTRAST (MRCP)  Technique:  Multiplanar multisequence MR imaging of the abdomen was performed without and with contrast, including heavily T2-weighted images of the biliary and pancreatic ducts.  Three-dimensional MR images were rendered by post processing of the original MR data.  Contrast: 20mL MULTIHANCE GADOBENATE DIMEGLUMINE 529 MG/ML IV SOLN  Comparison:   09/30/2012 ultrasound  Findings:  Atelectasis observed in both lung bases.  Mild intrahepatic biliary dilatation noted.  Postoperative fluid and edema noted in the gallbladder fossa.  A blind ending structure believed to represent cystic duct remnant attaches to the common duct very low in position, at the level of the pancreatic head.  There is a 4 mm filling defect within the distal portion of this cystic duct remnant, along with a 6 mm filling defect in the distal common bile duct just proximal to the ampule left; both of these are visible on images 22-24 of series 5. The common hepatic duct is dilated at 1.3 cm.  No dorsal pancreatic duct dilatation observed.  Peripancreatic lymph nodes measure up to 10 mm in short axis, and are probably reactive.  10 mm Bosniak category one cyst of the left kidney upper pole observed.  Pancreas and adrenal glands unremarkable.  Spleen appears normal.  Diffuse signal drop out on out-of-phase images in the liver is compatible with hepatic steatosis.  IMPRESSION:  1. Filling defects in the distal common bile duct and distal portion of the cystic duct remnant are compatible with small gallstones, and are associated with dilated common hepatic duct at 1.3 cm.  Please note that the cystic duct remnant attaches to the common duct very low in position, in the vicinity of the pancreatic head. 2.  Fluid and postoperative findings along the gallbladder fossa. Borderline enlarged adjacent reactive lymph nodes.  3.  Very minimal intrahepatic biliary dilatation. 4.  Atelectasis in both lower lobes.  5.  Bosniak category one cyst of the left kidney upper pole. 6.  Hepatic steatosis.   Original Report Authenticated By: Gaylyn Rong, M.D.    Mr Abd W/wo Cm/mrcp  10/04/2012  *RADIOLOGY REPORT*  Clinical Data:  Abdominal pain and vomiting.  Laparoscopic cholecystectomy on 11/16 for symptomatic gallstones and cholecystitis. Intraoperative cholangiogram cannot be performed due to friable  gallbladder.  Elevated transaminases.  Right upper quadrant abdominal pain.  MRI ABDOMEN WITHOUT AND WITH CONTRAST (MRCP)  Technique:  Multiplanar multisequence MR imaging of the abdomen was performed without and with contrast, including heavily T2-weighted images of the biliary and pancreatic ducts.  Three-dimensional MR images were rendered by post processing of the original MR data.  Contrast: 20mL MULTIHANCE GADOBENATE DIMEGLUMINE 529 MG/ML IV SOLN  Comparison:  09/30/2012 ultrasound  Findings:  Atelectasis observed in both lung bases.  Mild intrahepatic biliary dilatation noted.  Postoperative fluid and edema noted in the gallbladder fossa.  A blind ending structure believed to represent cystic duct remnant attaches to the common duct very low in position, at the level of the pancreatic head.  There is a 4 mm filling defect within the distal portion of this cystic duct remnant, along with a 6 mm filling defect in the distal common bile duct just proximal to the ampule left; both of these are visible on images 22-24 of series 5. The common hepatic duct is dilated at 1.3 cm.  No dorsal pancreatic duct dilatation observed.  Peripancreatic lymph nodes measure up to 10 mm in short axis, and are probably reactive.  10 mm Bosniak category one cyst of the left kidney upper pole observed.  Pancreas and adrenal glands unremarkable.  Spleen appears normal.  Diffuse signal drop out on out-of-phase images in the liver is compatible with hepatic steatosis.  IMPRESSION:  1. Filling defects in the distal common bile duct and distal portion of the cystic duct remnant are compatible with small gallstones, and are associated with dilated common hepatic duct at 1.3 cm.  Please note that the cystic duct remnant attaches to the common duct very low in position, in the vicinity of the pancreatic head. 2.  Fluid and postoperative findings along the gallbladder fossa. Borderline enlarged adjacent reactive lymph nodes. 3.  Very minimal  intrahepatic biliary dilatation. 4.  Atelectasis in both lower lobes.  5.  Bosniak category one cyst of the left kidney upper pole. 6.  Hepatic steatosis.   Original Report Authenticated By: Gaylyn Rong, M.D.      Medications: Scheduled Meds:   . ciprofloxacin  400 mg Intravenous BID  . [COMPLETED] gadobenate dimeglumine  20 mL Intravenous Once  . heparin subcutaneous  5,000 Units Subcutaneous Q8H  . influenza  inactive virus vaccine  0.5 mL Intramuscular Tomorrow-1000  . pantoprazole (PROTONIX) IV  40 mg Intravenous QHS  . potassium chloride  30 mEq Oral TID WC   Continuous Infusions:   . 0.9 % NaCl with KCl 40 mEq / L    . [DISCONTINUED] sodium chloride 75 mL/hr at 10/04/12 0127   PRN Meds:.acetaminophen, acetaminophen, HYDROmorphone (DILAUDID) injection, HYDROmorphone (DILAUDID) injection, ondansetron, ondansetron, oxyCODONE, polyethylene glycol    Assessment/Plan: 32 y.o. female with gallstone disease, retained CBD stones after lap chole  ERCP tomorrow, scheduled for 1:30pm in OR here at [facility].  Agree with letting her eat today, then NPO after MN    Rob Bunting, MD  10/04/2012, 10:53 AM  [facility] Pager [phone]

## 2012-10-05 NOTE — Anesthesia Procedure Notes (Signed)
Procedure Name: Intubation Date/Time: 10/05/2012 1:40 PM Performed by: Sherie Don Pre-anesthesia Checklist: Patient identified, Emergency Drugs available, Suction available, Patient being monitored and Timeout performed Patient Re-evaluated:Patient Re-evaluated prior to inductionOxygen Delivery Method: Circle system utilized Preoxygenation: Pre-oxygenation with 100% oxygen Intubation Type: IV induction Ventilation: Mask ventilation without difficulty Laryngoscope Size: Mac and 3 Grade View: Grade I Tube type: Oral Tube size: 7.5 mm Number of attempts: 1 Airway Equipment and Method: Stylet Placement Confirmation: ETT inserted through vocal cords under direct vision,  positive ETCO2 and breath sounds checked- equal and bilateral Secured at: 21 cm Tube secured with: Tape Dental Injury: Teeth and Oropharynx as per pre-operative assessment

## 2012-10-05 NOTE — Progress Notes (Signed)
4 Days Post-Op  Subjective: Her pain is better, no further nausea or vomiting.  Objective: Vital signs in last 24 hours: Temp:  [97.9 F (36.6 C)-99 F (37.2 C)] 97.9 F (36.6 C) (11/20 0550) Pulse Rate:  [65-75] 65  (11/20 0550) Resp:  [18-19] 18  (11/20 0550) BP: (101-113)/(60-74) 101/60 mmHg (11/20 0550) SpO2:  [99 %-100 %] 100 % (11/20 0550) Weight:  [213 lb 13.5 oz (97 kg)] 213 lb 13.5 oz (97 kg) (11/19 1300) Last BM Date:  (preop)  Afebrile, VSS, LFT's stable, about the same, WBC is normal  Intake/Output from previous day: 11/19 0701 - 11/20 0700 In: 1677 [P.O.:360; I.V.:1317] Out: 550 [Urine:550] Intake/Output this shift:    General appearance: alert, cooperative and no distress GI: soft, non-tender; bowel sounds normal; no masses,  no organomegaly and incisions all look good.  Lab Results:   [facility] 10/05/12 0510 10/04/12 0650  WBC 6.6 9.1  HGB 12.2 11.8*  HCT 36.5 34.9*  PLT 342 292    BMET  Basename 10/05/12 0510 10/04/12 0650  NA 137 137  K 4.0 3.0*  CL 102 100  CO2 25 26  GLUCOSE 102* 103*  BUN 5* 4*  CREATININE 0.52 0.50  CALCIUM 9.0 8.6   PT/INR No results found for this basename: LABPROT:2,INR:2 in the last 72 hours   Lab 10/05/12 0510 10/04/12 0650 10/03/12 0500 10/02/12 0506 10/01/12 0539  AST 153* 156* 349* 209* 145*  ALT 502* 588* 790* 657* 760*  ALKPHOS 333* 256* 231* 146* 134*  BILITOT 1.0 1.2 2.0* 0.8 1.1  PROT 7.2 7.0 6.8 6.7 6.9  ALBUMIN 3.1* 2.9* 3.0* 2.9* 3.2*     Lipase     Component Value Date/Time   LIPASE 41 10/05/2012 0510     Studies/Results: Mr 3d Recon At Scanner  10/04/2012  *RADIOLOGY REPORT*  Clinical Data:  Abdominal pain and vomiting.  Laparoscopic cholecystectomy on 11/16 for symptomatic gallstones and cholecystitis. Intraoperative cholangiogram cannot be performed due to friable gallbladder.  Elevated transaminases.  Right upper quadrant abdominal pain.  MRI ABDOMEN WITHOUT AND WITH CONTRAST (MRCP)   Technique:  Multiplanar multisequence MR imaging of the abdomen was performed without and with contrast, including heavily T2-weighted images of the biliary and pancreatic ducts.  Three-dimensional MR images were rendered by post processing of the original MR data.  Contrast: 20mL MULTIHANCE GADOBENATE DIMEGLUMINE 529 MG/ML IV SOLN  Comparison:  09/30/2012 ultrasound  Findings:  Atelectasis observed in both lung bases.  Mild intrahepatic biliary dilatation noted.  Postoperative fluid and edema noted in the gallbladder fossa.  A blind ending structure believed to represent cystic duct remnant attaches to the common duct very low in position, at the level of the pancreatic head.  There is a 4 mm filling defect within the distal portion of this cystic duct remnant, along with a 6 mm filling defect in the distal common bile duct just proximal to the ampule left; both of these are visible on images 22-24 of series 5. The common hepatic duct is dilated at 1.3 cm.  No dorsal pancreatic duct dilatation observed.  Peripancreatic lymph nodes measure up to 10 mm in short axis, and are probably reactive.  10 mm Bosniak category one cyst of the left kidney upper pole observed.  Pancreas and adrenal glands unremarkable.  Spleen appears normal.  Diffuse signal drop out on out-of-phase images in the liver is compatible with hepatic steatosis.  IMPRESSION:  1. Filling defects in the distal common bile duct and  distal portion of the cystic duct remnant are compatible with small gallstones, and are associated with dilated common hepatic duct at 1.3 cm.  Please note that the cystic duct remnant attaches to the common duct very low in position, in the vicinity of the pancreatic head. 2.  Fluid and postoperative findings along the gallbladder fossa. Borderline enlarged adjacent reactive lymph nodes. 3.  Very minimal intrahepatic biliary dilatation. 4.  Atelectasis in both lower lobes.  5.  Bosniak category one cyst of the left kidney  upper pole. 6.  Hepatic steatosis.   Original Report Authenticated By: Gaylyn Rong, M.D.    Mr Abd W/wo Cm/mrcp  10/04/2012  *RADIOLOGY REPORT*  Clinical Data:  Abdominal pain and vomiting.  Laparoscopic cholecystectomy on 11/16 for symptomatic gallstones and cholecystitis. Intraoperative cholangiogram cannot be performed due to friable gallbladder.  Elevated transaminases.  Right upper quadrant abdominal pain.  MRI ABDOMEN WITHOUT AND WITH CONTRAST (MRCP)  Technique:  Multiplanar multisequence MR imaging of the abdomen was performed without and with contrast, including heavily T2-weighted images of the biliary and pancreatic ducts.  Three-dimensional MR images were rendered by post processing of the original MR data.  Contrast: 20mL MULTIHANCE GADOBENATE DIMEGLUMINE 529 MG/ML IV SOLN  Comparison:  09/30/2012 ultrasound  Findings:  Atelectasis observed in both lung bases.  Mild intrahepatic biliary dilatation noted.  Postoperative fluid and edema noted in the gallbladder fossa.  A blind ending structure believed to represent cystic duct remnant attaches to the common duct very low in position, at the level of the pancreatic head.  There is a 4 mm filling defect within the distal portion of this cystic duct remnant, along with a 6 mm filling defect in the distal common bile duct just proximal to the ampule left; both of these are visible on images 22-24 of series 5. The common hepatic duct is dilated at 1.3 cm.  No dorsal pancreatic duct dilatation observed.  Peripancreatic lymph nodes measure up to 10 mm in short axis, and are probably reactive.  10 mm Bosniak category one cyst of the left kidney upper pole observed.  Pancreas and adrenal glands unremarkable.  Spleen appears normal.  Diffuse signal drop out on out-of-phase images in the liver is compatible with hepatic steatosis.  IMPRESSION:  1. Filling defects in the distal common bile duct and distal portion of the cystic duct remnant are compatible  with small gallstones, and are associated with dilated common hepatic duct at 1.3 cm.  Please note that the cystic duct remnant attaches to the common duct very low in position, in the vicinity of the pancreatic head. 2.  Fluid and postoperative findings along the gallbladder fossa. Borderline enlarged adjacent reactive lymph nodes. 3.  Very minimal intrahepatic biliary dilatation. 4.  Atelectasis in both lower lobes.  5.  Bosniak category one cyst of the left kidney upper pole. 6.  Hepatic steatosis.   Original Report Authenticated By: Gaylyn Rong, M.D.     Medications:    . ciprofloxacin  400 mg Intravenous BID  . [COMPLETED] heparin subcutaneous  5,000 Units Subcutaneous Q8H  . influenza  inactive virus vaccine  0.5 mL Intramuscular Tomorrow-1000  . potassium chloride  30 mEq Oral TID WC  . [DISCONTINUED] pantoprazole (PROTONIX) IV  40 mg Intravenous QHS    Assessment/Plan Acute cholecystitis s/p: LAPAROSCOPIC CHOLECYSTECTOMY WITH NO INTRAOPERATIVE CHOLANGIOGRAM, 10/01/12, MW  Rising LFT's  Body mass index is 39.32   Plan:  ERCP today and home when ok with GI.  LOS: 5  days    Guerin Lashomb 10/05/2012

## 2012-10-05 NOTE — Anesthesia Preprocedure Evaluation (Addendum)
Anesthesia Evaluation  Patient identified by MRN, date of birth, ID band Patient awake    Reviewed: Allergy & Precautions, H&P , NPO status , Patient's Chart, lab work & pertinent test results, reviewed documented beta blocker date and time   Airway Mallampati: II TM Distance: >3 FB Neck ROM: full    Dental  (+) Dental Advisory Given and Teeth Intact   Pulmonary neg pulmonary ROS,  breath sounds clear to auscultation        Cardiovascular negative cardio ROS  Rhythm:regular     Neuro/Psych negative neurological ROS  negative psych ROS   GI/Hepatic negative GI ROS, Neg liver ROS,   Endo/Other  Morbid obesity  Renal/GU negative Renal ROS  negative genitourinary   Musculoskeletal   Abdominal   Peds  Hematology negative hematology ROS (+)   Anesthesia Other Findings See surgeon's H&P   Reproductive/Obstetrics negative OB ROS                          Anesthesia Physical Anesthesia Plan  ASA: III  Anesthesia Plan: General   Post-op Pain Management:    Induction: Intravenous  Airway Management Planned: Oral ETT  Additional Equipment:   Intra-op Plan:   Post-operative Plan: Extubation in OR  Informed Consent: I have reviewed the patients History and Physical, chart, labs and discussed the procedure including the risks, benefits and alternatives for the proposed anesthesia with the patient or authorized representative who has indicated his/her understanding and acceptance.   Dental Advisory Given  Plan Discussed with: CRNA and Surgeon  Anesthesia Plan Comments:         Anesthesia Quick Evaluation

## 2012-10-05 NOTE — Anesthesia Postprocedure Evaluation (Signed)
Anesthesia Post Note  Patient: Vicki Roberts  Procedure(s) Performed: Procedure(s) (LRB): ENDOSCOPIC RETROGRADE CHOLANGIOPANCREATOGRAPHY (ERCP) (N/A)  Anesthesia type: General  Patient location: PACU  Post pain: Pain level controlled and Adequate analgesia  Post assessment: Post-op Vital signs reviewed, Patient's Cardiovascular Status Stable, Respiratory Function Stable, Patent Airway and Pain level controlled  Last Vitals:  Filed Vitals:   10/05/12 1515  BP:   Pulse: 71  Temp: 36.7 C  Resp: 12    Post vital signs: Reviewed and stable  Level of consciousness: awake, alert  and oriented  Complications: No apparent anesthesia complications

## 2012-10-06 ENCOUNTER — Encounter (HOSPITAL_COMMUNITY): Payer: Self-pay | Admitting: Gastroenterology

## 2012-10-06 DIAGNOSIS — Z6839 Body mass index (BMI) 39.0-39.9, adult: Secondary | ICD-10-CM

## 2012-10-06 DIAGNOSIS — Z6841 Body Mass Index (BMI) 40.0 and over, adult: Secondary | ICD-10-CM

## 2012-10-06 DIAGNOSIS — K8001 Calculus of gallbladder with acute cholecystitis with obstruction: Secondary | ICD-10-CM | POA: Diagnosis present

## 2012-10-06 HISTORY — DX: Body mass index (BMI) 39.0-39.9, adult: Z68.39

## 2012-10-06 LAB — COMPREHENSIVE METABOLIC PANEL
AST: 71 U/L — ABNORMAL HIGH (ref 0–37)
Albumin: 3.4 g/dL — ABNORMAL LOW (ref 3.5–5.2)
Albumin: 3.5 g/dL (ref 3.5–5.2)
Alkaline Phosphatase: 376 U/L — ABNORMAL HIGH (ref 39–117)
BUN: 7 mg/dL (ref 6–23)
Calcium: 9.8 mg/dL (ref 8.4–10.5)
Chloride: 100 mEq/L (ref 96–112)
Chloride: 98 mEq/L (ref 96–112)
Creatinine, Ser: 0.57 mg/dL (ref 0.50–1.10)
Potassium: 3.6 mEq/L (ref 3.5–5.1)
Sodium: 137 mEq/L (ref 135–145)
Total Bilirubin: 0.8 mg/dL (ref 0.3–1.2)
Total Bilirubin: 0.8 mg/dL (ref 0.3–1.2)
Total Protein: 8.2 g/dL (ref 6.0–8.3)

## 2012-10-06 MED ORDER — OXYCODONE-ACETAMINOPHEN 5-325 MG PO TABS: 1.0000 | ORAL_TABLET | ORAL | Status: DC | PRN

## 2012-10-06 MED ORDER — POLYETHYLENE GLYCOL 3350 17 G PO PACK: 17.0000 g | PACK | Freq: Every day | ORAL | Status: DC | PRN

## 2012-10-06 MED ORDER — ACETAMINOPHEN 325 MG PO TABS: 650.0000 mg | ORAL_TABLET | Freq: Four times a day (QID) | ORAL | Status: DC | PRN

## 2012-10-06 MED ORDER — OXYCODONE-ACETAMINOPHEN 5-325 MG PO TABS
1.0000 | ORAL_TABLET | ORAL | Status: DC | PRN
Start: 2012-10-06 — End: 2012-10-28

## 2012-10-06 NOTE — Progress Notes (Signed)
     [facility] Daily Rounding Note 10/06/2012, 8:24 AM  SUBJECTIVE:       Tolerating solids.  Denies abdominal pain.  For discharge home today  OBJECTIVE:         Vital signs in last 24 hours:    Temp:  [98 F (36.7 C)-98.2 F (36.8 C)] 98.1 F (36.7 C) (11/21 0603) Pulse Rate:  [66-82] 66  (11/21 0603) Resp:  [12-19] 14  (11/21 0603) BP: (96-129)/(56-81) 96/56 mmHg (11/21 0603) SpO2:  [96 %-100 %] 96 % (11/21 0603) Last BM Date: 09/30/12 General: looks well, no ditress   Did not reexamine pt. Neuro/Psych:  Pleasant, bright affect, cooperative.   Intake/Output from previous day: 11/20 0701 - 11/21 0700 In: 3191.3 [I.V.:2991.3; IV Piggyback:200] Out: 1150 [Urine:1150]  Intake/Output this shift:    Lab Results:  Basename 10/05/12 0510 10/04/12 0650  WBC 6.6 9.1  HGB 12.2 11.8*  HCT 36.5 34.9*  PLT 342 292   BMET  Basename 10/06/12 0455 10/05/12 0510 10/04/12 0650  NA 137 137 137  K 4.1 4.0 3.0*  CL 100 102 100  CO2 25 25 26   GLUCOSE 132* 102* 103*  BUN 7 5* 4*  CREATININE 0.57 0.52 0.50  CALCIUM 9.8 9.0 8.6   LFT  Basename 10/06/12 0455 10/05/12 0510 10/04/12 0650  PROT 7.7 7.2 7.0  ALBUMIN 3.4* 3.1* 2.9*  AST 86* 153* 156*  ALT 436* 502* 588*  ALKPHOS 362* 333* 256*  BILITOT 0.8 1.0 1.2  BILIDIR -- -- --  IBILI -- -- --     ASSESMENT: *  Lap chole 11/16 *  Choledocholithiasis.  ERCP with sphinct and stone extraction 11/20.  Doing well, tolerating solids.    *  Fatty liver *  Obesity.  BMI 39.4  PLAN: *  Agree with discharge home today.  *  GI follow up PRN.     LOS: 6 days   Jennye Moccasin  10/06/2012, 8:24 AM Pager: [phone]

## 2012-10-06 NOTE — Progress Notes (Signed)
1 Day Post-Op  Subjective: Feels better had chicken and veggies last pm , no pain this AM wants to go home.  Objective: Vital signs in last 24 hours: Temp:  [98 F (36.7 C)-98.2 F (36.8 C)] 98.1 F (36.7 C) (11/21 0603) Pulse Rate:  [66-82] 66  (11/21 0603) Resp:  [12-19] 14  (11/21 0603) BP: (96-129)/(56-81) 96/56 mmHg (11/21 0603) SpO2:  [96 %-100 %] 96 % (11/21 0603) Last BM Date: 09/30/12  Diet: low fat, VSS, LFT's imprving, some  Intake/Output from previous day: 11/20 0701 - 11/21 0700 In: 3191.3 [I.V.:2991.3; IV Piggyback:200] Out: 1150 [Urine:1150] Intake/Output this shift:    General appearance: alert, cooperative and no distress GI: soft, not really tender, just sore, +BS, no BM so far.  Lab Results:   [facility] 10/05/12 0510 10/04/12 0650  WBC 6.6 9.1  HGB 12.2 11.8*  HCT 36.5 34.9*  PLT 342 292    BMET  Basename 10/06/12 0455 10/05/12 0510  NA 137 137  K 4.1 4.0  CL 100 102  CO2 25 25  GLUCOSE 132* 102*  BUN 7 5*  CREATININE 0.57 0.52  CALCIUM 9.8 9.0   PT/INR No results found for this basename: LABPROT:2,INR:2 in the last 72 hours   Lab 10/06/12 0455 10/05/12 0510 10/04/12 0650 10/03/12 0500 10/02/12 0506  AST 86* 153* 156* 349* 209*  ALT 436* 502* 588* 790* 657*  ALKPHOS 362* 333* 256* 231* 146*  BILITOT 0.8 1.0 1.2 2.0* 0.8  PROT 7.7 7.2 7.0 6.8 6.7  ALBUMIN 3.4* 3.1* 2.9* 3.0* 2.9*     Lipase     Component Value Date/Time   LIPASE 41 10/05/2012 0510     Studies/Results: Dg Ercp With Sphincterotomy  10/05/2012  *RADIOLOGY REPORT*  Clinical Data: Choledocholithiasis  ERCP  Comparison:  MRCP 10/03/2012  Technique:  Multiple spot images obtained with the fluoroscopic device and submitted for interpretation post-procedure.  ERCP was performed by Dr.  Christella Hartigan.  Findings: 4 spot fluoroscopic images document endoscopic cannulation of the common bile duct and opacification of the CBD. There is passage of a balloon catheter.  The intrahepatic  biliary ducts are incompletely opacified, appearing decompressed centrally. Vascular clips project in the region of the gallbladder fossa.  IMPRESSION:  1.  ERCP with balloon sweeping.  These images were submitted for radiologic interpretation only. Please see the procedural report for the amount of contrast and the fluoroscopy time utilized.   Original Report Authenticated By: D. Andria Rhein, MD     Medications:    . ciprofloxacin  400 mg Intravenous BID  . influenza  inactive virus vaccine  0.5 mL Intramuscular Tomorrow-1000  . potassium chloride  30 mEq Oral TID WC    Assessment/Plan Acute cholecystitis s/p: LAPAROSCOPIC CHOLECYSTECTOMY WITH NO INTRAOPERATIVE CHOLANGIOGRAM, 10/01/12, MW   ERCP with sphincterotomy/papillotomy and ERCP with removal of calculus/calculi, 10/05/2012, Rachael Fee, MD Findings:  CBD stones, removed with biliary sphincterotomy and balloon sweeping. Rising LFT's   Body mass index is 39.32   Plan:  Home today, have her recheck her LFT's in a couple weeks and follow up with Dr. Dwain Sarna, or [facility] in 2-3 weeks.   LOS: 6 days    Vicki Roberts 10/06/2012 \

## 2012-10-06 NOTE — Progress Notes (Signed)
Patient discharged to home with family.  Discharge teaching completed including follow up care, medications, diet and signs and symptoms of infections.  Verbalizes understanding with no further questions. Vital signs stable, tolerating regular diet without complaints of nausea, no complaints of pain. Discharged per wheelchair with husband.

## 2012-10-06 NOTE — Discharge Summary (Signed)
Physician Discharge Summary  Patient ID: Vicki Roberts MRN: [id number] DOB/AGE: [birth year] 31 y.o.  Admit date: 09/30/2012 Discharge date: 10/06/2012  Admission Diagnoses: Acute cholecystitis  Body mass index is 39.32      Discharge Diagnoses: Same Active Problems:  Gallbladder calculus with acute cholecystitis and obstruction  Elevated liver function tests  BMI 39.0-39.9,adult   PROCEDURES: s/p: LAPAROSCOPIC CHOLECYSTECTOMY WITH NO INTRAOPERATIVE CHOLANGIOGRAM, 10/01/12, Silva Bandy  ERCP with sphincterotomy/papillotomy and ERCP with removal of calculus/calculi, 10/05/2012, Rachael Fee, MD Findings: CBD stones, removed with biliary sphincterotomy and balloon sweeping.   Hospital Course: 1 week history of intermittent RUQ abdominal pain. Worse over last day. Pain is sharp and located in RUQ. Pt has nausea and vomiting. U/S shows gallstones and CBD 11 mm. Slight elevationof LFT's. Pt was admitted and taken to OR on 10/01/12.  They were not able to do an IOC because of the friability of the cystic duct.  The follow day she was still having pain and LFT's were up on 10/03/12 she had ongoing LFT elevation and was seen by GI, DR. Christella Hartigan. They obtained and MRCP the following day which confirmed filling defects in the CBD and distal portion of the cystic duct. On 10/05/12 she underwent ERCP with stone removal and sphincterotomy.  She has done well with this and is anxious to go home.  i used the translator phone to discuss discharge with her and her husband.  We will recheck labs in 2 weeks and follow up in office 2-3 weeks.  I ask her to find a primary care so they could monitor her glucose.  Condition on D/C:  Improved  Disposition:      Medication List     As of 10/06/2012  8:39 AM    TAKE these medications         acetaminophen 325 MG tablet   Commonly known as: TYLENOL   Take 2 tablets (650 mg total) by mouth every 6 (six) hours as needed (or Temp > 100).     oxyCODONE-acetaminophen 5-325 MG per tablet   Commonly known as: PERCOCET/ROXICET   Take 1-2 tablets by mouth every 4 (four) hours as needed.      polyethylene glycol packet   Commonly known as: MIRALAX / GLYCOLAX   Take 17 g by mouth daily as needed.        Follow-up Information    Follow up with [facility], [place]. Schedule an appointment as soon as possible for a visit in 2 weeks. (Call for an appointment with the "[facility]," clinic or Dr. Dwain Sarna in 2-3 weeks)    Contact information:   [address] [place] [address] [phone]       Follow up with You need to get a primary care doctor to see you and make sure your glucose is normal at home..      Follow up with Go to [facility], for labs on:. On 10/17/2012.         SignedSherrie George 10/06/2012, 8:39 AM

## 2012-10-06 NOTE — Progress Notes (Signed)
I agree with the note above 

## 2012-10-28 ENCOUNTER — Encounter (INDEPENDENT_AMBULATORY_CARE_PROVIDER_SITE_OTHER): Payer: Self-pay | Admitting: General Surgery

## 2012-10-28 ENCOUNTER — Ambulatory Visit (INDEPENDENT_AMBULATORY_CARE_PROVIDER_SITE_OTHER): Payer: Self-pay | Admitting: General Surgery

## 2012-10-28 VITALS — BP 120/82 | HR 76 | Temp 97.7°F | Resp 12 | Ht 60.0 in | Wt 211.2 lb

## 2012-10-28 DIAGNOSIS — Z09 Encounter for follow-up examination after completed treatment for conditions other than malignant neoplasm: Secondary | ICD-10-CM

## 2012-10-28 NOTE — Progress Notes (Signed)
Subjective:     Patient ID: Vicki Roberts, female   DOB: 1981-11-14, 31 y.o.   MRN: [id number]  HPI This is a 31 year old female who was admitted to the hospital of cholecystitis. At preoperative for laparoscopic cholecystectomy on her cholangiogram she still had some stones. She subsequently has undergone an ERCP to clear her ducts. She was discharged home and has done well since then. She reports no fevers. Her pain is almost gone from her surgery now. She has had a little bit of constipation but this is getting resolved as well. She is eating with a normal appetite also at this point.  Review of Systems     Objective:   Physical Exam Well-healing incisions, the umbilical incision has a small hematoma next to it but there is no infection    Assessment:     Status post laparoscopic cholecystectomy and ERCP    Plan:     I told her she can return to full normal activity. She is also going to come back and see me as needed now. She will follow up with her primary care physician  As well.

## 2015-11-17 DIAGNOSIS — A048 Other specified bacterial intestinal infections: Secondary | ICD-10-CM

## 2015-11-17 HISTORY — DX: Other specified bacterial intestinal infections: A04.8

## 2016-11-16 NOTE — L&D Delivery Note (Signed)
Delivery Note  Called by RN for delivery. Upon arrival in the room, the infant had spontaneously delivered and was being attended to by the RN on the bed. Cord still attached. Delivery time of a viable female occurred at 1:56 AM a via Vaginal, Spontaneous Delivery (Presentation: ;  ).  APGAR: 9, 10; weight.    Placenta status: Delivered intact with gentle traction.   Cord: 3vc with the following complications: none.    Anesthesia:  none Episiotomy: None Lacerations: None Suture Repair: na. Est. Blood Loss (mL): 150  Mom to postpartum.  Baby to Couplet care / Skin to Skin.  Charlesetta Garibaldi Cristie Mckinney CNM 07/04/2017, 2:34 AM

## 2016-12-21 LAB — SICKLE CELL SCREEN: Sickle Cell Screen: NORMAL

## 2016-12-21 LAB — OB RESULTS CONSOLE PLATELET COUNT: PLATELETS: 369 10*3/uL

## 2016-12-21 LAB — OB RESULTS CONSOLE RPR: RPR: NONREACTIVE

## 2016-12-21 LAB — OB RESULTS CONSOLE RUBELLA ANTIBODY, IGM: RUBELLA: IMMUNE

## 2016-12-21 LAB — OB RESULTS CONSOLE ABO/RH: RH Type: POSITIVE

## 2016-12-21 LAB — OB RESULTS CONSOLE ANTIBODY SCREEN: Antibody Screen: NEGATIVE

## 2016-12-21 LAB — OB RESULTS CONSOLE VARICELLA ZOSTER ANTIBODY, IGG: VARICELLA IGG: IMMUNE

## 2016-12-21 LAB — CYTOLOGY - PAP: PAP SMEAR: NEGATIVE

## 2016-12-21 LAB — OB RESULTS CONSOLE HGB/HCT, BLOOD
HEMATOCRIT: 35 %
Hemoglobin: 11.7 g/dL

## 2016-12-21 LAB — OB RESULTS CONSOLE GC/CHLAMYDIA
Chlamydia: NEGATIVE
GC PROBE AMP, GENITAL: NEGATIVE

## 2016-12-21 LAB — OB RESULTS CONSOLE HEPATITIS B SURFACE ANTIGEN: Hepatitis B Surface Ag: NEGATIVE

## 2016-12-22 ENCOUNTER — Other Ambulatory Visit (HOSPITAL_COMMUNITY): Payer: Self-pay | Admitting: Nurse Practitioner

## 2016-12-22 DIAGNOSIS — Z3682 Encounter for antenatal screening for nuchal translucency: Secondary | ICD-10-CM

## 2016-12-22 DIAGNOSIS — Z3A12 12 weeks gestation of pregnancy: Secondary | ICD-10-CM

## 2016-12-22 DIAGNOSIS — O09521 Supervision of elderly multigravida, first trimester: Secondary | ICD-10-CM

## 2016-12-22 LAB — OB RESULTS CONSOLE HIV ANTIBODY (ROUTINE TESTING): HIV: NONREACTIVE

## 2016-12-22 LAB — LAB REPORT - SCANNED: HEMOGLOBIN-A1C: 5.9

## 2016-12-29 ENCOUNTER — Encounter: Payer: Self-pay | Admitting: *Deleted

## 2016-12-30 ENCOUNTER — Encounter: Payer: Self-pay | Admitting: *Deleted

## 2016-12-31 ENCOUNTER — Ambulatory Visit (HOSPITAL_COMMUNITY)
Admission: RE | Admit: 2016-12-31 | Discharge: 2016-12-31 | Disposition: A | Discharge status: Home / Self Care | Payer: Medicaid Other | Source: Ambulatory Visit | Attending: Nurse Practitioner | Admitting: Nurse Practitioner

## 2016-12-31 ENCOUNTER — Ambulatory Visit (HOSPITAL_COMMUNITY): Admission: RE | Admit: 2016-12-31 | Payer: Medicaid Other | Source: Ambulatory Visit

## 2016-12-31 ENCOUNTER — Encounter (HOSPITAL_COMMUNITY): Payer: Self-pay

## 2016-12-31 DIAGNOSIS — Z315 Encounter for genetic counseling: Secondary | ICD-10-CM | POA: Insufficient documentation

## 2016-12-31 DIAGNOSIS — O99211 Obesity complicating pregnancy, first trimester: Secondary | ICD-10-CM | POA: Insufficient documentation

## 2016-12-31 DIAGNOSIS — O09529 Supervision of elderly multigravida, unspecified trimester: Secondary | ICD-10-CM | POA: Insufficient documentation

## 2016-12-31 DIAGNOSIS — O09521 Supervision of elderly multigravida, first trimester: Secondary | ICD-10-CM | POA: Insufficient documentation

## 2016-12-31 DIAGNOSIS — Z3682 Encounter for antenatal screening for nuchal translucency: Secondary | ICD-10-CM | POA: Insufficient documentation

## 2016-12-31 DIAGNOSIS — Z3A12 12 weeks gestation of pregnancy: Secondary | ICD-10-CM | POA: Insufficient documentation

## 2016-12-31 NOTE — Progress Notes (Signed)
Genetic Counseling  High-Risk Gestation Note  Appointment Date:  12/31/2016 Referred By: Currie Paris, NP Date of Birth:  [birth year] Partner:  Harrel Lemon   Pregnancy History: [id number] Estimated Date of Delivery: 07/10/17 Estimated Gestational Age: [redacted]w[redacted]d Attending: Ledon Snare, MD   Ms. Vicki Roberts and her husband, Mr. Harrel Lemon, were seen for genetic counseling because of a maternal age of 36 y.o..  Spanish/English medical interpreter [phone] from [facility] provided interpretation for today's visit.   In summary:  Discussed AMA and associated risk for fetal aneuploidy  Discussed options for screening  First screen-declined  Quad screen-declined  NIPS- declined  Ultrasound- NT ultrasound performed today, within normal limits; offer detailed anatomy ultrasound in second trimester  Discussed diagnostic testing options  Amniocentesis- declined  Reviewed family history concerns  Discussed carrier screening options - declined  CF  SMA  Hemoglobinopathies  They were counseled regarding maternal age and the association with risk for chromosome conditions due to nondisjunction with aging of the ova.   We reviewed chromosomes, nondisjunction, and the associated 1 in 64 risk for fetal aneuploidy related to a maternal age of 36 y.o. at [redacted]w[redacted]d gestation.  They were counseled that the risk for aneuploidy decreases as gestational age increases, accounting for those pregnancies which spontaneously abort.  We specifically discussed Down syndrome (trisomy 36), trisomies 88 and 35, and sex chromosome aneuploidies (47,XXX and 47,XXY) including the common features and prognoses of each.   We reviewed available screening options including First Screen, Quad screen, noninvasive prenatal screening (NIPS)/cell free DNA (cfDNA) screening, and detailed ultrasound. They were counseled that screening tests are used to modify a patient's a priori risk for aneuploidy, typically  based on age. This estimate provides a pregnancy specific risk assessment. We reviewed the benefits and limitations of each option. Specifically, we discussed the conditions for which each test screens, the detection rates, and false positive rates of each. They were also counseled regarding diagnostic testing via amniocentesis. We reviewed the approximate 1 in 300-500 risk for complications from amniocentesis, including spontaneous pregnancy loss. We discussed the possible results that the tests might provide including: positive, negative, unanticipated, and no result. Finally, they were counseled regarding the cost of each option and potential out of pocket expenses.  After consideration of all the options, she elected to pursue ultrasound only in the pregnancy and declined all additional screening or testing for fetal aneuploidy, stating that she did not feel it was necessary during the pregnancy. She declined First screen, Quad screen, NIPS, and amniocentesis.    A nuchal translucency ultrasound was performed today, and was within normal limits.  The report will be documented separately.  A detailed ultrasound is available to the patient at ~18+ weeks gestation.  No appointment was scheduled at this time. They understand that screening tests cannot rule out all birth defects or genetic syndromes. The patient was advised of this limitation and states she still does not want additional testing at this time.   Ms. Aubrei Bouchie  was provided with written information regarding cystic fibrosis (CF), spinal muscular atrophy (SMA) and hemoglobinopathies including the carrier frequency, availability of carrier screening and prenatal diagnosis if indicated.  In addition, we discussed that CF and hemoglobinopathies are routinely screened for as part of the [place] newborn screening panel.  After further discussion, she declined screening for CF, SMA and hemoglobinopathies.  Both family histories were reviewed and  found to be noncontributory for birth defects, intellectual disability, and known genetic conditions.  Without further information regarding the provided family history, an accurate genetic risk cannot be calculated. Further genetic counseling is warranted if more information is obtained.  Mrs. Vicki Bachuth Galindo Leyva denied exposure to environmental toxins or chemical agents. She denied the use of alcohol, tobacco or street drugs. She denied significant viral illnesses during the course of her pregnancy. Her medical and surgical histories were contributory for gestational diabetes. Diabetic education and transfer of OB care visits are scheduled for 01/04/17.    I counseled this couple regarding the above risks and available options.  The approximate face-to-face time with the genetic counselor was 50 minutes.  Quinn PlowmanKaren Aditi Rovira, MS,  Certified Genetic Counselor 12/31/2016

## 2017-01-04 ENCOUNTER — Ambulatory Visit: Payer: Self-pay | Admitting: *Deleted

## 2017-01-04 ENCOUNTER — Ambulatory Visit (INDEPENDENT_AMBULATORY_CARE_PROVIDER_SITE_OTHER): Payer: Medicaid Other | Admitting: Obstetrics and Gynecology

## 2017-01-04 ENCOUNTER — Encounter: Payer: Self-pay | Admitting: Obstetrics and Gynecology

## 2017-01-04 ENCOUNTER — Encounter: Payer: Self-pay | Attending: Obstetrics | Admitting: *Deleted

## 2017-01-04 VITALS — BP 120/77 | HR 109 | Wt 216.0 lb

## 2017-01-04 DIAGNOSIS — E669 Obesity, unspecified: Secondary | ICD-10-CM

## 2017-01-04 DIAGNOSIS — Z713 Dietary counseling and surveillance: Secondary | ICD-10-CM | POA: Insufficient documentation

## 2017-01-04 DIAGNOSIS — O24419 Gestational diabetes mellitus in pregnancy, unspecified control: Secondary | ICD-10-CM | POA: Insufficient documentation

## 2017-01-04 DIAGNOSIS — Z758 Other problems related to medical facilities and other health care: Secondary | ICD-10-CM | POA: Insufficient documentation

## 2017-01-04 DIAGNOSIS — O99211 Obesity complicating pregnancy, first trimester: Secondary | ICD-10-CM

## 2017-01-04 DIAGNOSIS — O09521 Supervision of elderly multigravida, first trimester: Secondary | ICD-10-CM | POA: Diagnosis not present

## 2017-01-04 DIAGNOSIS — O99212 Obesity complicating pregnancy, second trimester: Secondary | ICD-10-CM | POA: Insufficient documentation

## 2017-01-04 DIAGNOSIS — O0990 Supervision of high risk pregnancy, unspecified, unspecified trimester: Secondary | ICD-10-CM

## 2017-01-04 DIAGNOSIS — Z6841 Body Mass Index (BMI) 40.0 and over, adult: Secondary | ICD-10-CM

## 2017-01-04 DIAGNOSIS — Z789 Other specified health status: Secondary | ICD-10-CM | POA: Insufficient documentation

## 2017-01-04 LAB — POCT URINALYSIS DIP (DEVICE)
Bilirubin Urine: NEGATIVE
GLUCOSE, UA: NEGATIVE mg/dL
Hgb urine dipstick: NEGATIVE
Ketones, ur: NEGATIVE mg/dL
NITRITE: NEGATIVE
PH: 6 (ref 5.0–8.0)
PROTEIN: NEGATIVE mg/dL
Specific Gravity, Urine: 1.025 (ref 1.005–1.030)
Urobilinogen, UA: 0.2 mg/dL (ref 0.0–1.0)

## 2017-01-04 MED ORDER — ASPIRIN EC 81 MG PO TBEC: 81.0000 mg | DELAYED_RELEASE_TABLET | Freq: Every day | ORAL | 3 refills | Status: DC

## 2017-01-04 NOTE — Progress Notes (Signed)
Here for first prenatal visit- transferring care from health department. Used Interpreter Vicki Roberts. Given new patient education packet. Declines flu shot.

## 2017-01-04 NOTE — Progress Notes (Signed)
New OB Note  01/04/2017   [facility]  Chief Complaint: NOB  Transfer of Care Patient: Yes, [facility]  History of Present Illness: Ms. Vicki Roberts is a 36 y.o. [id number] @ 12/3 weeks (EDC 8/17, based on 12wk NT u/s).  Patient's last menstrual period was 10/03/2016 (exact date).) Preg complicated by has Elevated liver function tests; BMI 40.0-44.9, adult (HCC); Advanced maternal age in multigravida; Supervision of high-risk pregnancy; Obesity affecting pregnancy in first trimester; GDM (1st trimester dx); and Language barrier on her problem list.   No PTL or SAB s/s.  No n/v of pregnancy s/s.  Only medication she was on when she found out she was pregnant (late December) was metformin and she stopped that when she found out. She restarted the metformin two weeks ago. She was off metformin for about a month when she did her glucola and GTT testing.   ROS: A 12-point review of systems was performed and negative, except as stated in the above HPI.  OBGYN History: As per HPI. OB History  Gravida Para Term Preterm AB Living  4 3 3     3   SAB TAB Ectopic Multiple Live Births          3    # Outcome Date GA Lbr Len/2nd Weight Sex Delivery Anes PTL Lv  4 Current           3 Term 10/30/04 2652w0d  6 lb (2.722 kg) F Vag-Spont None N LIV  2 Term 02/22/03 8452w0d  6 lb 11 oz (3.033 kg) F Vag-Spont EPI N LIV  1 Term 05/03/01 1152w0d  6 lb 7 oz (2.92 kg) F Vag-Spont None N LIV      Any issues with any prior pregnancies: no Prior children are healthy, doing well, without any problems or issues: yes History of pap smears: Yes. Last pap smear 2018, negative   Past Medical History: Past Medical History:  Diagnosis Date  . BMI 39.0-39.9,adult 10/06/2012  . Diabetes mellitus without complication (HCC)   . Gall stones 2013  . H. pylori infection 2017  . Varicose veins of both lower extremities     Past Surgical History: Past Surgical History:  Procedure Laterality Date  .  CHOLECYSTECTOMY  10/01/2012   Procedure: LAPAROSCOPIC CHOLECYSTECTOMY WITH INTRAOPERATIVE CHOLANGIOGRAM;  Surgeon: Emelia LoronMatthew Wakefield, MD;  [facility];  Service: General;  Laterality: N/A;  . ERCP  10/05/2012   Procedure: ENDOSCOPIC RETROGRADE CHOLANGIOPANCREATOGRAPHY (ERCP);  Surgeon: Rachael Feeaniel P Jacobs, MD;  [facility];  Service: Endoscopy;  Laterality: N/A;    Family History:  Family History  Problem Relation Age of Onset  . Hypertension Mother   . Hypertension Father   . Asthma Sister     Social History:  Social History   Social History  . Marital status: Married    Spouse name: N/A  . Number of children: N/A  . Years of education: N/A   Occupational History  . Not on file.   Social History Main Topics  . Smoking status: Never Smoker  . Smokeless tobacco: Never Used  . Alcohol use No  . Drug use: No  . Sexual activity: Yes    Birth control/ protection: None, Rhythm   Other Topics Concern  . Not on file   Social History Narrative  . No narrative on file    Allergy: No Known Allergies  Health Maintenance:  Mammogram Up to Date: not applicable  Current Outpatient Medications: Metformin 500mg  after breakfast PNV  Physical Exam:   BP 120/77   Pulse (!) 109   Wt 216 lb (98 kg)   LMP 10/03/2016 (Exact Date)   BMI 42.18 kg/m  Body mass index is 42.18 kg/m.  FHTs: normal  General appearance: Well nourished, well developed female in no acute distress.   Laboratory: [facility] labs B pos/RI/VI/rpr neg/hepB/pap neg 2018/gc-ct/UDS Pending: HIV, hemoglobinopathy Failed 1hr and failed 3hr  Imaging:  NT scan report reviewed  Assessment: patient stable  Plan: 1. Supervision of high risk pregnancy, antepartum Routine care. Anatomy u/s already scheduled. Dates changed today (moved up approx 1wk). f/u HD HIV and hemoglobinopathy testing - [place] MFM OB COMP + 14 WK; Future 2. Elderly multigravida in first trimester Seen by [facility] already. 1st trimester screen  only. Offer AFP after 15wks. F/u anatomy scan  3. BMI 40.0-44.9, adult (HCC) See below  4. Obesity affecting pregnancy in first trimester Advised to start baby ASA today. Baseline pre-x labs today - Protein / creatinine ratio, urine - Comprehensive metabolic panel  5. Language barrier Interpreter used  6. GDM early diagnosis a1c at [facility] 5.9. Patient states she was told she was pre-DM and was put on metformin in 09/2016. See HPI. D/w pt uncertainty of PO meds with diabetes, in terms of fetal effects but very likely safe and okay to stay on. Pt would like to stay on it. Seen by DM today and supplies given. Follow up Cr from [facility] today.  Routine care Needs fetal echo at around 22-23wks  Problem list reviewed and updated.  Follow up in 10 days  >50% of 30 min visit spent on counseling and coordination of care.     Cornelia Copa MD Attending [facility])

## 2017-01-04 NOTE — Progress Notes (Signed)
  Patient was seen on 01/04/2017 for Gestational Diabetes self-management . She states she was referred by the Health Department and saw the nutritionist there. She reports no previous history of GDM. The following learning objectives were met by the patient :   States the definition of Gestational Diabetes  States why dietary management is important in controlling blood glucose  Describes the effects of carbohydrates on blood glucose levels  Demonstrates ability to create a balanced meal plan  Demonstrates carbohydrate counting   States when to check blood glucose levels  Demonstrates proper blood glucose monitoring techniques  States the effect of stress and exercise on blood glucose levels  States the importance of limiting caffeine and abstaining from alcohol and smoking  Diet history indicates excellent eating habits and variety of all food groups  Plan:  Aim for 3 Carb Choices per meal (45 grams) +/- 1 either way  Aim for 1-2 Carbs per snack Begin reading food labels for Total Carbohydrate of foods Consider  increasing your activity level by walking or other activity daily as tolerated Begin checking BG before breakfast and 2 hours after first bite of breakfast, lunch and dinner as directed by MD  Take medication if directed by MD  Blood glucose monitor given: True Track Lot # [id number] Exp: 02/13/2018 Blood glucose reading: 137 mg/dl post breakfast  Patient instructed to monitor glucose levels: FBS: 60 - <90 2 hour: <120  Patient received the following handouts:  Nutrition Diabetes and Pregnancy in Spanish  Carbohydrate Counting List in Spanish  Patient will be seen for follow-up as needed.

## 2017-01-05 LAB — COMPREHENSIVE METABOLIC PANEL
A/G RATIO: 1 — AB (ref 1.2–2.2)
ALT: 44 IU/L — ABNORMAL HIGH (ref 0–32)
AST: 17 IU/L (ref 0–40)
Albumin: 3.8 g/dL (ref 3.5–5.5)
Alkaline Phosphatase: 67 IU/L (ref 39–117)
BUN/Creatinine Ratio: 18 (ref 9–23)
BUN: 9 mg/dL (ref 6–20)
Bilirubin Total: 0.2 mg/dL (ref 0.0–1.2)
CALCIUM: 9.1 mg/dL (ref 8.7–10.2)
CO2: 21 mmol/L (ref 18–29)
Chloride: 98 mmol/L (ref 96–106)
Creatinine, Ser: 0.51 mg/dL — ABNORMAL LOW (ref 0.57–1.00)
GFR, EST AFRICAN AMERICAN: 143 (ref >59)
GFR, EST NON AFRICAN AMERICAN: 124 (ref >59)
GLOBULIN, TOTAL: 3.7 (ref 1.5–4.5)
Glucose: 86 mg/dL (ref 65–99)
POTASSIUM: 4 mmol/L (ref 3.5–5.2)
Sodium: 135 mmol/L (ref 134–144)
Total Protein: 7.5 g/dL (ref 6.0–8.5)

## 2017-01-05 LAB — PROTEIN / CREATININE RATIO, URINE
Creatinine, Urine: 173.8 mg/dL
PROTEIN UR: 22.7 mg/dL
Protein/Creat Ratio: 131 (ref 0–200)

## 2017-01-14 ENCOUNTER — Ambulatory Visit (INDEPENDENT_AMBULATORY_CARE_PROVIDER_SITE_OTHER): Payer: Medicaid Other | Admitting: Obstetrics & Gynecology

## 2017-01-14 VITALS — BP 134/70 | HR 100 | Wt 217.0 lb

## 2017-01-14 DIAGNOSIS — O24419 Gestational diabetes mellitus in pregnancy, unspecified control: Secondary | ICD-10-CM

## 2017-01-14 DIAGNOSIS — O09522 Supervision of elderly multigravida, second trimester: Secondary | ICD-10-CM

## 2017-01-14 DIAGNOSIS — O0990 Supervision of high risk pregnancy, unspecified, unspecified trimester: Secondary | ICD-10-CM

## 2017-01-14 MED ORDER — METFORMIN HCL 500 MG PO TABS: 500.0000 mg | ORAL_TABLET | Freq: Two times a day (BID) | ORAL | 3 refills | Status: DC

## 2017-01-14 NOTE — Patient Instructions (Signed)
Regrese a la clinica cuando tenga su cita. Si tiene problemas o preguntas, llama a la clinica o vaya a la sala de emergencia al [facility].    

## 2017-01-14 NOTE — Progress Notes (Signed)
   PRENATAL VISIT NOTE  Subjective:  Vicki Roberts is a 36 y.o. [phone] at 5585w6d being seen today for ongoing prenatal care.  She is currently monitored for the following issues for this high-risk pregnancy and has Elevated liver function tests; BMI 40.0-44.9, adult (HCC); Advanced maternal age in multigravida; Supervision of high-risk pregnancy; Obesity affecting pregnancy in first trimester; GDM (1st trimester dx); and Language barrier on her problem list.  Patient is Spanish-speaking only, Spanish interpreter present for this encounter.  Patient reports no complaints.  Contractions: Not present. Vag. Bleeding: None.  Movement: Present. Denies leaking of fluid.   The following portions of the patient's history were reviewed and updated as appropriate: allergies, current medications, past family history, past medical history, past social history, past surgical history and problem list. Problem list updated.  Objective:   Vitals:   01/14/17 1235  BP: 134/70  Pulse: 100  Weight: 217 lb (98.4 kg)    Fetal Status: Fetal Heart Rate (bpm): 148   Movement: Present     General:  Alert, oriented and cooperative. Patient is in no acute distress.  Skin: Skin is warm and dry. No rash noted.   Cardiovascular: Normal heart rate noted  Respiratory: Normal respiratory effort, no problems with respiration noted  Abdomen: Soft, gravid, appropriate for gestational age. Pain/Pressure: Present     Pelvic:  Cervical exam deferred        Extremities: Normal range of motion.  Edema: None  Mental Status: Normal mood and affect. Normal behavior. Normal judgment and thought content.   Blood Sugars:    Assessment and Plan:  Pregnancy: G4P3003 at 3885w6d  1. Gestational diabetes mellitus (GDM), antepartum, gestational diabetes method of control unspecified Given elevated fasting values, Metformin added at night, now 500 mg bid. Will adjust regimen as needed. Anatomy scan already scheduled. Fetal ECHO to  be scheduled. - metFORMIN (GLUCOPHAGE) 500 MG tablet; Take 1 tablet (500 mg total) by mouth 2 (two) times daily with a meal.  Dispense: 60 tablet; Refill: 3  2. Elderly multigravida in second trimester Still declines all genetic screening. [facility]  3. Supervision of high risk pregnancy, antepartum No other complaints or concerns.  Routine obstetric precautions reviewed. Please refer to After Visit Summary for other counseling recommendations.  Return in about 2 weeks (around 01/28/2017) for OB Visit.   Tereso NewcomerUgonna A Reegan Mctighe, MD

## 2017-01-14 NOTE — Progress Notes (Signed)
Attempted to make fetal echo appointment but they are not taking appointments that far in advance yet. Need to call in about two weeks to schedule.

## 2017-01-28 ENCOUNTER — Ambulatory Visit (INDEPENDENT_AMBULATORY_CARE_PROVIDER_SITE_OTHER): Payer: Medicaid Other | Admitting: Obstetrics and Gynecology

## 2017-01-28 VITALS — BP 117/66 | HR 95 | Wt 215.0 lb

## 2017-01-28 DIAGNOSIS — R7989 Other specified abnormal findings of blood chemistry: Secondary | ICD-10-CM | POA: Diagnosis not present

## 2017-01-28 DIAGNOSIS — O99212 Obesity complicating pregnancy, second trimester: Secondary | ICD-10-CM

## 2017-01-28 DIAGNOSIS — O99211 Obesity complicating pregnancy, first trimester: Secondary | ICD-10-CM

## 2017-01-28 DIAGNOSIS — Z789 Other specified health status: Secondary | ICD-10-CM

## 2017-01-28 DIAGNOSIS — R945 Abnormal results of liver function studies: Secondary | ICD-10-CM

## 2017-01-28 DIAGNOSIS — E669 Obesity, unspecified: Secondary | ICD-10-CM | POA: Diagnosis not present

## 2017-01-28 DIAGNOSIS — O0992 Supervision of high risk pregnancy, unspecified, second trimester: Secondary | ICD-10-CM

## 2017-01-28 NOTE — Progress Notes (Signed)
Prenatal Visit Note Date: 01/28/2017 [facility]  Subjective:  Vicki Roberts is a 36 y.o. [phone] at 5960w6d being seen today for ongoing prenatal care.  She is currently monitored for the following issues for this high-risk pregnancy and has Elevated liver function tests; BMI 40.0-44.9, adult (HCC); Advanced maternal age in multigravida; Supervision of high-risk pregnancy; Obesity affecting pregnancy in first trimester; GDM, class A2; and Language barrier on her problem list.  Patient reports no complaints.   Contractions: Not present. Vag. Bleeding: None.  Movement: Present. Denies leaking of fluid.   The following portions of the patient's history were reviewed and updated as appropriate: allergies, current medications, past family history, past medical history, past social history, past surgical history and problem list. Problem list updated.  Objective:   Vitals:   01/28/17 1246  BP: 117/66  Pulse: 95  Weight: 215 lb (97.5 kg)    Fetal Status: Fetal Heart Rate (bpm): 140   Movement: Present     General:  Alert, oriented and cooperative. Patient is in no acute distress.  Skin: Skin is warm and dry. No rash noted.   Cardiovascular: Normal heart rate noted  Respiratory: Normal respiratory effort, no problems with respiration noted  Abdomen: Soft, gravid, appropriate for gestational age. Pain/Pressure: Absent     Pelvic:  Cervical exam deferred        Extremities: Normal range of motion.  Edema: None  Mental Status: Normal mood and affect. Normal behavior. Normal judgment and thought content.   Urinalysis:      Assessment and Plan:  Pregnancy: G4P3003 at 760w6d  1. Supervision of high risk pregnancy in second trimester Routine care. Has anatomy scan already scheduled for 4/3 - AFP, Serum, Open Spina Bifida  2. Obesity affecting pregnancy in first trimester Routine care  3. Language barrier Interpreter used  4. Elevated liver function  tests Rpt today - Comprehensive metabolic panel  5. GDMa2 Continue with metformin 500bid. Normal BS log today. Check a1c today. Schedule fetal echo at nv.   Preterm labor symptoms and general obstetric precautions including but not limited to vaginal bleeding, contractions, leaking of fluid and fetal movement were reviewed in detail with the patient. Please refer to After Visit Summary for other counseling recommendations.  Return in about 2 weeks (around 02/11/2017).   Richland Bingharlie Tamel Abel, MD

## 2017-02-02 LAB — COMPREHENSIVE METABOLIC PANEL
A/G RATIO: 1.1 — AB (ref 1.2–2.2)
ALT: 64 IU/L — ABNORMAL HIGH (ref 0–32)
AST: 21 IU/L (ref 0–40)
Albumin: 3.6 g/dL (ref 3.5–5.5)
Alkaline Phosphatase: 56 IU/L (ref 39–117)
BUN/Creatinine Ratio: 21 (ref 9–23)
BUN: 9 mg/dL (ref 6–20)
Bilirubin Total: 0.2 mg/dL (ref 0.0–1.2)
CALCIUM: 9.1 mg/dL (ref 8.7–10.2)
CO2: 20 mmol/L (ref 18–29)
Chloride: 99 mmol/L (ref 96–106)
Creatinine, Ser: 0.43 mg/dL — ABNORMAL LOW (ref 0.57–1.00)
GFR, EST AFRICAN AMERICAN: 151 mL/min/{1.73_m2} (ref >59)
GFR, EST NON AFRICAN AMERICAN: 131 mL/min/{1.73_m2} (ref >59)
Globulin, Total: 3.3 g/dL (ref 1.5–4.5)
Glucose: 86 mg/dL (ref 65–99)
POTASSIUM: 3.9 mmol/L (ref 3.5–5.2)
Sodium: 137 mmol/L (ref 134–144)
TOTAL PROTEIN: 6.9 g/dL (ref 6.0–8.5)

## 2017-02-02 LAB — AFP, SERUM, OPEN SPINA BIFIDA
AFP MOM: 0.91
AFP Value: 30.6 ng/mL
GEST. AGE ON COLLECTION DATE: 17.9 wk
Maternal Age At EDD: 36.8 years
OSBR Risk 1 IN: 10000
TEST RESULTS AFP: NEGATIVE
Weight: 217 [lb_av]

## 2017-02-02 LAB — HGB A1C W/O EAG: Hgb A1c MFr Bld: 5.6 % (ref 4.8–5.6)

## 2017-02-11 ENCOUNTER — Ambulatory Visit (INDEPENDENT_AMBULATORY_CARE_PROVIDER_SITE_OTHER): Payer: Medicaid Other | Admitting: Obstetrics and Gynecology

## 2017-02-11 VITALS — BP 95/69 | HR 95 | Wt 218.3 lb

## 2017-02-11 DIAGNOSIS — R7989 Other specified abnormal findings of blood chemistry: Secondary | ICD-10-CM | POA: Diagnosis not present

## 2017-02-11 DIAGNOSIS — O0992 Supervision of high risk pregnancy, unspecified, second trimester: Secondary | ICD-10-CM

## 2017-02-11 DIAGNOSIS — O24419 Gestational diabetes mellitus in pregnancy, unspecified control: Secondary | ICD-10-CM | POA: Diagnosis not present

## 2017-02-11 DIAGNOSIS — R945 Abnormal results of liver function studies: Secondary | ICD-10-CM

## 2017-02-11 LAB — POCT URINALYSIS DIP (DEVICE)
Bilirubin Urine: NEGATIVE
Glucose, UA: NEGATIVE mg/dL
HGB URINE DIPSTICK: NEGATIVE
Ketones, ur: NEGATIVE mg/dL
NITRITE: NEGATIVE
PH: 6 (ref 5.0–8.0)
Protein, ur: NEGATIVE mg/dL
Specific Gravity, Urine: 1.025 (ref 1.005–1.030)
UROBILINOGEN UA: 0.2 mg/dL (ref 0.0–1.0)

## 2017-02-11 NOTE — Progress Notes (Signed)
Spanish video interpreter "rose" #[id number] used for visit

## 2017-02-11 NOTE — Patient Instructions (Signed)
La diabetes mellitus y los alimentos (Diabetes Mellitus and Food) Es importante que controle su nivel de azcar en la sangre (glucosa). El nivel de glucosa en sangre depende en gran medida de lo que usted come. Comer alimentos saludables en las cantidades adecuadas a lo largo del da, aproximadamente a la misma hora todos los das, lo ayudar a controlar su nivel de glucosa en sangre. Tambin puede ayudarlo a retrasar o evitar el empeoramiento de la diabetes mellitus. Comer de manera saludable incluso puede ayudarlo a mejorar el nivel de presin arterial y a alcanzar o mantener un peso saludable. Entre las recomendaciones generales para alimentarse y cocinar los alimentos de forma saludable, se incluyen las siguientes:  Respetar las comidas principales y comer colaciones con regularidad. Evitar pasar largos perodos sin comer con el fin de perder peso.  Seguir una dieta que consista principalmente en alimentos de origen vegetal, como frutas, vegetales, frutos secos, legumbres y cereales integrales.  Utilizar mtodos de coccin a baja temperatura, como hornear, en lugar de mtodos de coccin a alta temperatura, como frer en abundante aceite. Trabaje con el nutricionista para aprender a usar la informacin nutricional de las etiquetas de los alimentos. CMO PUEDEN AFECTARME LOS ALIMENTOS? Carbohidratos Los carbohidratos afectan el nivel de glucosa en sangre ms que cualquier otro tipo de alimento. El nutricionista lo ayudar a determinar cuntos carbohidratos puede consumir en cada comida y ensearle a contarlos. El recuento de carbohidratos es importante para mantener la glucosa en sangre en un nivel saludable, en especial si utiliza insulina o toma determinados medicamentos para la diabetes mellitus. Alcohol El alcohol puede provocar disminuciones sbitas de la glucosa en sangre (hipoglucemia), en especial si utiliza insulina o toma determinados medicamentos para la diabetes mellitus. La  hipoglucemia es una afeccin que puede poner en peligro la vida. Los sntomas de la hipoglucemia (somnolencia, mareos y desorientacin) son similares a los sntomas de haber consumido mucho alcohol. Si el mdico lo autoriza a beber alcohol, hgalo con moderacin y siga estas pautas:  Las mujeres no deben beber ms de un trago por da, y los hombres no deben beber ms de dos tragos por da. Un trago es igual a:  12 onzas (355 ml) de cerveza  5 onzas de vino (150 ml) de vino  1,5onzas (45ml) de bebidas espirituosas  No beba con el estmago vaco.  Mantngase hidratado. Beba agua, gaseosas dietticas o t helado sin azcar.  Las gaseosas comunes, los jugos y otros refrescos podran contener muchos carbohidratos y se deben contar. QU ALIMENTOS NO SE RECOMIENDAN? Cuando haga las elecciones de alimentos, es importante que recuerde que todos los alimentos son distintos. Algunos tienen menos nutrientes que otros por porcin, aunque podran tener la misma cantidad de caloras o carbohidratos. Es difcil darle al cuerpo lo que necesita cuando consume alimentos con menos nutrientes. Estos son algunos ejemplos de alimentos que debera evitar ya que contienen muchas caloras y carbohidratos, pero pocos nutrientes:  Grasas trans (la mayora de los alimentos procesados incluyen grasas trans en la etiqueta de Informacin nutricional).  Gaseosas comunes.  Jugos.  Caramelos.  Dulces, como tortas, pasteles, rosquillas y galletas.  Comidas fritas. QU ALIMENTOS PUEDO COMER? Consuma alimentos ricos en nutrientes, que nutrirn el cuerpo y lo mantendrn saludable. Los alimentos que debe comer tambin dependern de varios factores, como:  Las caloras que necesita.  Los medicamentos que toma.  Su peso.  El nivel de glucosa en sangre.  El nivel de presin arterial.  El nivel de colesterol. Debe consumir   una amplia variedad de alimentos, por ejemplo:  Protenas.  Cortes de carne  magros.  Protenas con bajo contenido de grasas saturadas, como pescado, clara de huevo y frijoles. Evite las carnes procesadas.  Frutas y vegetales.  Frutas y vegetales que pueden ayudar a controlar los niveles sanguneos de glucosa, como manzanas, mangos y batatas.  Productos lcteos.  Elija productos lcteos sin grasa o con bajo contenido de grasa, como leche, yogur y queso.  Cereales, panes, pastas y arroz.  Elija cereales integrales, como panes multicereales, avena en grano y arroz integral. Estos alimentos pueden ayudar a controlar la presin arterial.  Grasas.  Alimentos que contengan grasas saludables, como frutos secos, aguacate, aceite de oliva, aceite de canola y pescado. TODOS LOS QUE PADECEN DIABETES MELLITUS TIENEN EL MISMO PLAN DE COMIDAS? Dado que todas las personas que padecen diabetes mellitus son distintas, no hay un solo plan de comidas que funcione para todos. Es muy importante que se rena con un nutricionista que lo ayudar a crear un plan de comidas adecuado para usted. Esta informacin no tiene como fin reemplazar el consejo del mdico. Asegrese de hacerle al mdico cualquier pregunta que tenga. Document Released: 02/09/2008 Document Revised: 11/23/2014 Document Reviewed: 09/29/2013 Elsevier Interactive Patient Education  2017 Elsevier Inc.  

## 2017-02-11 NOTE — Progress Notes (Signed)
   PRENATAL VISIT NOTE  Subjective:  Vicki BachRuth Galindo Roberts is a 36 y.o. [phone] at 6975w6d being seen today for ongoing prenatal care.  She is currently monitored for the following issues for this low-risk pregnancy and has Elevated liver function tests; BMI 40.0-44.9, adult (HCC); Advanced maternal age in multigravida; Supervision of high-risk pregnancy; Obesity affecting pregnancy in first trimester; GDM, class A2; and Language barrier on her problem list.  Patient reports no complaints.  Contractions: Not present. Vag. Bleeding: None.  Movement: Present. Denies leaking of fluid.   The following portions of the patient's history were reviewed and updated as appropriate: allergies, current medications, past family history, past medical history, past social history, past surgical history and problem list. Problem list updated.  Objective:   Vitals:   02/11/17 1406  BP: 95/69  Pulse: 95  Weight: 218 lb 4.8 oz (99 kg)    Fetal Status: Fetal Heart Rate (bpm): 145   Movement: Present     General:  Alert, oriented and cooperative. Patient is in no acute distress.  Skin: Skin is warm and dry. No rash noted.   Cardiovascular: Normal heart rate noted  Respiratory: Normal respiratory effort, no problems with respiration noted  Abdomen: Soft, gravid, appropriate for gestational age. Pain/Pressure: Present     Pelvic:  Cervical exam deferred        Extremities: Normal range of motion.  Edema: None  Mental Status: Normal mood and affect. Normal behavior. Normal judgment and thought content.   Assessment and Plan:  Pregnancy: G4P3003 at 2675w6d  GDM, class A2  Elevated liver function tests  Supervision of high risk pregnancy in second trimester  -Patient doing well. All post prandial at goal for past week, 2 of the fasting were elevated but only to low 100's -patient taking ASA and metformin 500mg  BID. Continue at current dosing.  -return in 4 wks for continued GDMA2 management.  -fetal echo  ordered today -will request HIV results from [facility]   There are no diagnoses linked to this encounter. Preterm labor symptoms and general obstetric precautions including but not limited to vaginal bleeding, contractions, leaking of fluid and fetal movement were reviewed in detail with the patient. Please refer to After Visit Summary for other counseling recommendations.  No Follow-up on file.   Lorne SkeensNicholas Michael Schenk, MD

## 2017-02-16 ENCOUNTER — Ambulatory Visit (HOSPITAL_COMMUNITY)
Admission: RE | Admit: 2017-02-16 | Discharge: 2017-02-16 | Disposition: A | Discharge status: Home / Self Care | Payer: Self-pay | Source: Ambulatory Visit | Attending: Obstetrics and Gynecology | Admitting: Obstetrics and Gynecology

## 2017-02-16 ENCOUNTER — Other Ambulatory Visit: Payer: Self-pay | Admitting: Obstetrics and Gynecology

## 2017-02-16 ENCOUNTER — Encounter (HOSPITAL_COMMUNITY): Payer: Self-pay

## 2017-02-16 DIAGNOSIS — O99212 Obesity complicating pregnancy, second trimester: Secondary | ICD-10-CM | POA: Insufficient documentation

## 2017-02-16 DIAGNOSIS — O09522 Supervision of elderly multigravida, second trimester: Secondary | ICD-10-CM | POA: Insufficient documentation

## 2017-02-16 DIAGNOSIS — Z363 Encounter for antenatal screening for malformations: Secondary | ICD-10-CM | POA: Insufficient documentation

## 2017-02-16 DIAGNOSIS — Z3A19 19 weeks gestation of pregnancy: Secondary | ICD-10-CM | POA: Insufficient documentation

## 2017-02-16 DIAGNOSIS — O0990 Supervision of high risk pregnancy, unspecified, unspecified trimester: Secondary | ICD-10-CM

## 2017-02-16 DIAGNOSIS — O24415 Gestational diabetes mellitus in pregnancy, controlled by oral hypoglycemic drugs: Secondary | ICD-10-CM | POA: Insufficient documentation

## 2017-02-16 DIAGNOSIS — O0992 Supervision of high risk pregnancy, unspecified, second trimester: Secondary | ICD-10-CM | POA: Insufficient documentation

## 2017-02-17 ENCOUNTER — Telehealth: Payer: Self-pay | Admitting: *Deleted

## 2017-02-17 ENCOUNTER — Other Ambulatory Visit (HOSPITAL_COMMUNITY): Payer: Self-pay | Admitting: *Deleted

## 2017-02-17 DIAGNOSIS — IMO0002 Reserved for concepts with insufficient information to code with codable children: Secondary | ICD-10-CM

## 2017-02-17 DIAGNOSIS — Z0489 Encounter for examination and observation for other specified reasons: Secondary | ICD-10-CM

## 2017-02-17 NOTE — Telephone Encounter (Signed)
Patient has been scheduled for a fetal echo on 5/3 @ 0930. Called patient using Spanish interpreter # [phone]. Voice mail was left with appointment date and time, please return my call for location info if needed.

## 2017-03-11 ENCOUNTER — Ambulatory Visit (INDEPENDENT_AMBULATORY_CARE_PROVIDER_SITE_OTHER): Payer: Self-pay | Admitting: Obstetrics and Gynecology

## 2017-03-11 VITALS — BP 116/58 | HR 92 | Wt 219.1 lb

## 2017-03-11 DIAGNOSIS — O24419 Gestational diabetes mellitus in pregnancy, unspecified control: Secondary | ICD-10-CM

## 2017-03-11 DIAGNOSIS — R945 Abnormal results of liver function studies: Secondary | ICD-10-CM

## 2017-03-11 DIAGNOSIS — O09522 Supervision of elderly multigravida, second trimester: Secondary | ICD-10-CM

## 2017-03-11 DIAGNOSIS — O0992 Supervision of high risk pregnancy, unspecified, second trimester: Secondary | ICD-10-CM

## 2017-03-11 DIAGNOSIS — Z789 Other specified health status: Secondary | ICD-10-CM

## 2017-03-11 DIAGNOSIS — R7989 Other specified abnormal findings of blood chemistry: Secondary | ICD-10-CM

## 2017-03-11 DIAGNOSIS — O99212 Obesity complicating pregnancy, second trimester: Secondary | ICD-10-CM

## 2017-03-11 NOTE — Progress Notes (Signed)
Prenatal Visit Note Date: 03/11/2017 Clinic: [facility]  Subjective:  Vicki Roberts is a 37 y.o. [phone] at [redacted]w[redacted]d being seen today for ongoing prenatal care.  She is currently monitored for the following issues for this high-risk pregnancy and has Elevated liver function tests; BMI 40.0-44.9, adult (HCC); Advanced maternal age in multigravida; Supervision of high-risk pregnancy; Obesity affecting pregnancy in second trimester; GDM, class A2; and Language barrier on her problem list.  Patient reports no complaints.   Contractions: Not present.  .  Movement: Present. Denies leaking of fluid.   The following portions of the patient's history were reviewed and updated as appropriate: allergies, current medications, past family history, past medical history, past social history, past surgical history and problem list. Problem list updated.  Objective:   Vitals:   03/11/17 1408  BP: (!) 116/58  Pulse: 92  Weight: 219 lb 1.6 oz (99.4 kg)    Fetal Status: Fetal Heart Rate (bpm): 133   Movement: Present     General:  Alert, oriented and cooperative. Patient is in no acute distress.  Skin: Skin is warm and dry. No rash noted.   Cardiovascular: Normal heart rate noted  Respiratory: Normal respiratory effort, no problems with respiration noted  Abdomen: Soft, gravid, appropriate for gestational age. Pain/Pressure: Present     Pelvic:  Cervical exam deferred        Extremities: Normal range of motion.  Edema: None  Mental Status: Normal mood and affect. Normal behavior. Normal judgment and thought content.   Urinalysis:      Assessment and Plan:  Pregnancy: G4P3003 at [redacted]w[redacted]d  1. Supervision of high risk pregnancy in second trimester Routine care. Continue baby asa. tsk sent to inbasket to see if HD HIV is back yet.   2. Obesity affecting pregnancy in second trimester See below  3. Language barrier Interpreter used  4. Elevated liver function tests Rpt cmp  with 28wk labs  5. Elderly multigravida in second trimester 1st trimester, afp and anatomy negative.   6. GDM, class A2 Normal bs log on metformin 500/500. Has growth u/s and fetal echo already scheduled for may.   Preterm labor symptoms and general obstetric precautions including but not limited to vaginal bleeding, contractions, leaking of fluid and fetal movement were reviewed in detail with the patient. Please refer to After Visit Summary for other counseling recommendations.  Return in about 2 weeks (around 03/25/2017) for 2-3wk rob.   [place] Bing, MD

## 2017-03-30 ENCOUNTER — Encounter (HOSPITAL_COMMUNITY): Payer: Self-pay

## 2017-03-30 ENCOUNTER — Other Ambulatory Visit (HOSPITAL_COMMUNITY): Payer: Self-pay | Admitting: Maternal & Fetal Medicine

## 2017-03-30 ENCOUNTER — Ambulatory Visit (HOSPITAL_COMMUNITY)
Admission: RE | Admit: 2017-03-30 | Discharge: 2017-03-30 | Disposition: A | Discharge status: Home / Self Care | Payer: Self-pay | Source: Ambulatory Visit | Attending: Obstetrics and Gynecology | Admitting: Obstetrics and Gynecology

## 2017-03-30 ENCOUNTER — Other Ambulatory Visit (HOSPITAL_COMMUNITY): Payer: Self-pay | Admitting: *Deleted

## 2017-03-30 DIAGNOSIS — O0992 Supervision of high risk pregnancy, unspecified, second trimester: Secondary | ICD-10-CM

## 2017-03-30 DIAGNOSIS — O99212 Obesity complicating pregnancy, second trimester: Secondary | ICD-10-CM | POA: Insufficient documentation

## 2017-03-30 DIAGNOSIS — O09522 Supervision of elderly multigravida, second trimester: Secondary | ICD-10-CM | POA: Insufficient documentation

## 2017-03-30 DIAGNOSIS — O24415 Gestational diabetes mellitus in pregnancy, controlled by oral hypoglycemic drugs: Secondary | ICD-10-CM

## 2017-03-30 DIAGNOSIS — Z362 Encounter for other antenatal screening follow-up: Secondary | ICD-10-CM | POA: Insufficient documentation

## 2017-03-30 DIAGNOSIS — Z3A25 25 weeks gestation of pregnancy: Secondary | ICD-10-CM | POA: Insufficient documentation

## 2017-03-30 DIAGNOSIS — Z0489 Encounter for examination and observation for other specified reasons: Secondary | ICD-10-CM

## 2017-03-30 DIAGNOSIS — IMO0002 Reserved for concepts with insufficient information to code with codable children: Secondary | ICD-10-CM

## 2017-04-01 ENCOUNTER — Ambulatory Visit (INDEPENDENT_AMBULATORY_CARE_PROVIDER_SITE_OTHER): Payer: Self-pay | Admitting: Obstetrics & Gynecology

## 2017-04-01 VITALS — BP 103/69 | HR 105 | Wt 219.9 lb

## 2017-04-01 DIAGNOSIS — O24419 Gestational diabetes mellitus in pregnancy, unspecified control: Secondary | ICD-10-CM

## 2017-04-01 DIAGNOSIS — O0992 Supervision of high risk pregnancy, unspecified, second trimester: Secondary | ICD-10-CM

## 2017-04-01 MED ORDER — METFORMIN HCL 500 MG PO TABS: ORAL_TABLET | ORAL | 3 refills | Status: DC

## 2017-04-01 NOTE — Patient Instructions (Signed)

## 2017-04-01 NOTE — Progress Notes (Signed)
   PRENATAL VISIT NOTE  Subjective:  Vicki Roberts is a 36 y.o. [phone] at 5334w5d being seen today for ongoing prenatal care.  She is currently monitored for the following issues for this high-risk pregnancy and has Elevated liver function tests; BMI 40.0-44.9, adult (HCC); Advanced maternal age in multigravida; Supervision of high-risk pregnancy; Obesity affecting pregnancy in second trimester; GDM, class A2; and Language barrier on her problem list.  Patient reports no complaints.   .  .  Movement: Present. Denies leaking of fluid.   The following portions of the patient's history were reviewed and updated as appropriate: allergies, current medications, past family history, past medical history, past social history, past surgical history and problem list. Problem list updated.  Objective:   Vitals:   04/01/17 1446  BP: 103/69  Pulse: (!) 105  Weight: 219 lb 14.4 oz (99.7 kg)    Fetal Status: Fetal Heart Rate (bpm): 138   Movement: Present     General:  Alert, oriented and cooperative. Patient is in no acute distress.  Skin: Skin is warm and dry. No rash noted.   Cardiovascular: Normal heart rate noted  Respiratory: Normal respiratory effort, no problems with respiration noted  Abdomen: Soft, gravid, appropriate for gestational age.       Pelvic:  Cervical exam deferred        Extremities: Normal range of motion.  Edema: None  Mental Status: Normal mood and affect. Normal behavior. Normal judgment and thought content.   Assessment and Plan:  Pregnancy: G4P3003 at 4234w5d  1. Gestational diabetes mellitus (GDM), antepartum, gestational diabetes method of control unspecified FBS >50 % >95, 90 % PP are in range, increase PM dose of medication - metFORMIN (GLUCOPHAGE) 500 MG tablet; One tablet by mouth in the morning and 2 in the evening  Dispense: 90 tablet; Refill: 3  2. Supervision of high risk pregnancy in second trimester US in 3 weeks  3. GDM, class A2 fastings elevated  modestly  Preterm labor symptoms and general obstetric precautions including but not limited to vaginal bleeding, contractions, leaking of fluid and fetal movement were reviewed in detail with the patient. Please refer to After Visit Summary for other counseling recommendations.  Return in about 2 weeks (around 04/15/2017).   Scheryl DarterJames Yanuel Tagg, MD

## 2017-04-20 ENCOUNTER — Ambulatory Visit (INDEPENDENT_AMBULATORY_CARE_PROVIDER_SITE_OTHER): Payer: Self-pay | Admitting: Obstetrics & Gynecology

## 2017-04-20 DIAGNOSIS — O0993 Supervision of high risk pregnancy, unspecified, third trimester: Secondary | ICD-10-CM

## 2017-04-20 DIAGNOSIS — Z23 Encounter for immunization: Secondary | ICD-10-CM

## 2017-04-20 DIAGNOSIS — O09523 Supervision of elderly multigravida, third trimester: Secondary | ICD-10-CM

## 2017-04-20 NOTE — Patient Instructions (Addendum)
Tercer trimestre de Psychiatrist (Third Trimester of Pregnancy)  Dolor de espalda durante el embarazo (Back Pain in Pregnancy) El dolor de espalda es habitual durante el embarazo. Puede deberse a varios factores relacionados con los cambios durante esta etapa. INSTRUCCIONES PARA EL CUIDADO EN EL HOGAR Control del dolor, la rigidez y la hinchazn  Si se lo indican, aplique hielo en caso de dolor de espalda repentino (agudo). ? Ponga el hielo en una bolsa plstica. ? Coloque una [employer] piel y la bolsa de hielo. ? Coloque el hielo durante , 2 a 3veces por da.  Si se lo indican, aplique calor en la zona afectada antes de realizar ejercicios: ? Coloque una toalla entre la piel y la compresa de calor o la almohadilla trmica. ? Aplique el calor durante 20 a . ? Retire la fuente de calor si la piel se le pone de color rojo brillante. Esto es muy importante si no puede sentir el dolor, el calor o el fro. Puede correr un riesgo mayor de sufrir quemaduras. Actividad  Haga ejercicio como se lo haya indicado el mdico. Hacer ejercicio es la mejor forma de Automotive engineer o Human resources officer de espalda.  Prstele atencin a su cuerpo cuando se levante. Si siente dolor al levantarse, pida ayuda o flexione las rodillas. [employer], se usan los msculos de las piernas en lugar de los de la espalda.  Pngase en cuclillas al levantar algo del suelo. No se agache.  Haga reposo en cama nicamente como se lo haya indicado el mdico. El reposo en cama solo debe hacerse cuando los episodios de dolor de espalda son ms intensos. Pararse, sentarse y acostarse  No permanezca sentada o de pie en el mismo lugar durante largos perodos.  Cuando est sentada, adopte una Education officer, museum. Asegrese de que su cabeza descansa sobre sus hombros y no est colgando hacia delante. Use una almohada en la parte inferior de la espalda si es necesario.  Trate de dormir de lado, de preferencia el lado  izquierdo, con una o [employer] piernas. Si est dolorida despus de una noche de descanso, la cama puede ser [employer]. Un colchn duro puede brindarle ms apoyo para la Merchandiser, retail. Instrucciones generales  No use zapatos con tacones altos.  Consumir una dieta saludable. Trate de aumentar de peso dentro de las recomendaciones del mdico.  Use una faja de maternidad, un arns elstico o un cors para la espalda como se lo haya indicado el mdico.  Tome los medicamentos de venta libre y los recetados solamente como se lo haya indicado el mdico.  Oceanographer a todas las visitas de control como se lo haya indicado el mdico. Esto es importante. Incluye las visitas a los especialistas, como un fisioterapeuta. SOLICITE ATENCIN MDICA SI:  El dolor de espalda le impide realizar las actividades cotidianas.  Aumenta el dolor en otras partes del cuerpo. SOLICITE ATENCIN MDICA DE INMEDIATO SI:  Siente entumecimiento, hormigueo, debilidad o problemas con el uso de los brazos o las piernas.  Siente un dolor de espalda intenso que no puede controlar con los medicamentos.  Tiene modificaciones repentinas en el control de la vejiga o el intestino.  Siente que le falta el aire, se marea o se desmaya.  Tiene nuseas, vmitos o sudoracin.  Siente un dolor de espalda que es rtmico y de tipo clico, similar a las contracciones del [place]. Las contracciones del parto suelen aparecer cada 1 a , duran aproximadamente  y estn acompaadas de una sensacin de empujar o de presin en la pelvis.  Tiene dolor de espalda y rompe la bolsa de las aguas o tiene sangrado vaginal.  El dolor o el adormecimiento se extienden hacia la pierna.  El dolor aparece despus de una cada.  Siente dolor de un solo lado.  Observa sangre en la orina.  Le aparecen ampollas en la piel en la zona del dolor de espalda. Esta informacin no tiene Theme park managercomo fin reemplazar el  consejo del mdico. Asegrese de hacerle al mdico cualquier pregunta que tenga. Document Released: 07/15/2011 Document Revised: 02/24/2016 Document Reviewed: 07/17/2015 Elsevier Interactive Patient Education  2018 [employer]. El tercer trimestre comprende desde la semana29 The ServiceMaster Companyhasta la semana42, es decir, desde el mes7 hasta el mes9. En este trimestre, el feto crece muy rpido. Hacia el final del noveno mes, el feto mide alrededor de 20pulgadas (45cm) de largo y pesa entre 6y 10libras [phone](2,700y 4,500kg). CUIDADOS EN EL HOGAR  No fume, no consuma hierbas ni beba alcohol. No tome frmacos que el mdico no haya autorizado.  No consuma ningn producto que contenga tabaco, lo que incluye cigarrillos, tabaco de Theatre managermascar o Administrator, Civil Servicecigarrillos electrnicos. Si necesita ayuda para dejar de fumar, consulte al American Expressmdico. Puede recibir asesoramiento u otro tipo de apoyo para dejar de fumar.  Tome los medicamentos solamente como se lo haya indicado el mdico. Algunos medicamentos son seguros para tomar durante el Psychiatristembarazo y otros no lo son.  Haga ejercicios solamente como se lo haya indicado el mdico. Interrumpa la actividad fsica si comienza a tener calambres.  Ingiera alimentos saludables de Baileymanera regular.  Use un sostn que le brinde buen soporte si sus mamas estn sensibles.  No se d baos de inmersin en agua caliente, baos turcos ni saunas.  Colquese el cinturn de seguridad cuando conduzca.  No coma carne cruda ni queso sin cocinar; evite el contacto con las bandejas sanitarias de los gatos y la tierra que estos animales usan.  Tome las vitaminas prenatales.  Tome entre 1500 y 2000mg  de calcio diariamente comenzando en la semana20 del embarazo [place] el parto.  Pruebe tomar un medicamento que la ayude a defecar (un laxante suave) si el mdico lo autoriza. Consuma ms fibra, que se encuentra en las frutas y verduras frescas y los cereales integrales. Beba suficiente lquido para mantener el  pis (orina) claro o de color amarillo plido.  Dese baos de asiento con agua tibia para Engineer, materialsaliviar el dolor o las molestias causadas por las hemorroides. Use una crema para las hemorroides si el mdico la autoriza.  Si se le hinchan las venas (venas varicosas), use medias de descanso. Levante (eleve) los pies durante 15minutos, 3 o 4veces por Futures traderda. Limite el consumo de sal en su dieta.  No levante objetos pesados, use zapatos de tacones bajos y sintese derecha.  Descanse con las piernas elevadas si tiene calambres o dolor de cintura.  Visite a su dentista si no lo ha Occupational hygienisthecho durante el embarazo. Use un cepillo de cerdas suaves para cepillarse los dientes. Psese el hilo dental con suavidad.  Puede seguir Calpine Corporationmanteniendo relaciones sexuales, a menos que el mdico le indique lo contrario.  No haga viajes de larga distancia, excepto si es obligatorio y solamente con la aprobacin del mdico.  Tome clases prenatales.  Practique ir manejando al hospital.  Prepare el bolso que llevar al hospital.  Prepare la habitacin del beb.  Concurra a los controles mdicos.  SOLICITE AYUDA SI:  No est segura de  si est en trabajo de parto o si ha roto la bolsa de las aguas.  Tiene mareos.  Siente calambres leves o presin en la parte inferior del abdomen.  Sufre un dolor persistente en el abdomen.  Tiene Programme researcher, broadcasting/film/video (nuseas), vmitos, o tiene deposiciones acuosas (diarrea).  Advierte un olor ftido que proviene de la vagina.  Siente dolor al [employer].  SOLICITE AYUDA DE INMEDIATO SI:  Tiene fiebre.  Tiene una prdida de lquido por la vagina.  Tiene sangrado o pequeas prdidas vaginales.  Siente dolor intenso o clicos en el abdomen.  Sube o baja de peso rpidamente.  Tiene dificultades para recuperar el aliento y siente dolor en el pecho.  Sbitamente se le hinchan mucho el rostro, las [place], los tobillos, los pies o las piernas.  No ha sentido los movimientos del beb  durante Georgianne Fick.  Siente un dolor de cabeza intenso que no se alivia con medicamentos.  Su visin se modifica.  Esta informacin no tiene Theme park manager el consejo del mdico. Asegrese de hacerle al mdico cualquier pregunta que tenga. Document Released: 07/05/2013 Document Revised: 11/23/2014 Document Reviewed: 01/03/2013 Elsevier Interactive Patient Education  2017 [employer].

## 2017-04-20 NOTE — Progress Notes (Signed)
   PRENATAL VISIT NOTE  Subjective:  Vicki Roberts is a 36 y.o. [phone] at 5923w3d being seen today for ongoing prenatal care.  She is currently monitored for the following issues for this high-risk pregnancy and has Elevated liver function tests; BMI 40.0-44.9, adult (HCC); Advanced maternal age in multigravida; Supervision of high-risk pregnancy; Obesity affecting pregnancy in second trimester; GDM, class A2; and Language barrier on her problem list.  Patient reports backache.   .  .   . Denies leaking of fluid.   The following portions of the patient's history were reviewed and updated as appropriate: allergies, current medications, past family history, past medical history, past social history, past surgical history and problem list. Problem list updated.  Objective:  There were no vitals filed for this visit.  Fetal Status:   Fundal Height: 29 cm       General:  Alert, oriented and cooperative. Patient is in no acute distress.  Skin: Skin is warm and dry. No rash noted.   Cardiovascular: Normal heart rate noted  Respiratory: Normal respiratory effort, no problems with respiration noted  Abdomen: Soft, gravid, appropriate for gestational age.       Pelvic:  Cervical exam deferred        Extremities: Normal range of motion.     Mental Status: Normal mood and affect. Normal behavior. Normal judgment and thought content.   Assessment and Plan:  Pregnancy: G4P3003 at 7323w3d  1. Need for diphtheria-tetanus-pertussis (Tdap) vaccine  - Tdap vaccine greater than or equal to 7yo IM  2. Supervision of high risk pregnancy in third trimester FBS most in range and pp all <120  Preterm labor symptoms and general obstetric precautions including but not limited to vaginal bleeding, contractions, leaking of fluid and fetal movement were reviewed in detail with the patient. Please refer to After Visit Summary for other counseling recommendations.  Return in about 2 weeks (around  05/04/2017). US f/u 1 week  Scheryl DarterJames Regine Christian, MD

## 2017-04-27 ENCOUNTER — Ambulatory Visit (HOSPITAL_COMMUNITY)
Admission: RE | Admit: 2017-04-27 | Discharge: 2017-04-27 | Disposition: A | Discharge status: Home / Self Care | Payer: Self-pay | Source: Ambulatory Visit | Attending: Obstetrics and Gynecology | Admitting: Obstetrics and Gynecology

## 2017-04-27 ENCOUNTER — Other Ambulatory Visit (HOSPITAL_COMMUNITY): Payer: Self-pay | Admitting: Maternal and Fetal Medicine

## 2017-04-27 ENCOUNTER — Encounter (HOSPITAL_COMMUNITY): Payer: Self-pay

## 2017-04-27 DIAGNOSIS — O99213 Obesity complicating pregnancy, third trimester: Secondary | ICD-10-CM | POA: Insufficient documentation

## 2017-04-27 DIAGNOSIS — Z6841 Body Mass Index (BMI) 40.0 and over, adult: Secondary | ICD-10-CM | POA: Insufficient documentation

## 2017-04-27 DIAGNOSIS — O09522 Supervision of elderly multigravida, second trimester: Secondary | ICD-10-CM

## 2017-04-27 DIAGNOSIS — O24415 Gestational diabetes mellitus in pregnancy, controlled by oral hypoglycemic drugs: Secondary | ICD-10-CM

## 2017-04-27 DIAGNOSIS — O09523 Supervision of elderly multigravida, third trimester: Secondary | ICD-10-CM

## 2017-04-27 DIAGNOSIS — Z3A29 29 weeks gestation of pregnancy: Secondary | ICD-10-CM | POA: Insufficient documentation

## 2017-04-27 DIAGNOSIS — O0993 Supervision of high risk pregnancy, unspecified, third trimester: Secondary | ICD-10-CM

## 2017-04-27 DIAGNOSIS — E669 Obesity, unspecified: Secondary | ICD-10-CM | POA: Insufficient documentation

## 2017-04-28 ENCOUNTER — Other Ambulatory Visit (HOSPITAL_COMMUNITY): Payer: Self-pay | Admitting: *Deleted

## 2017-04-28 DIAGNOSIS — O24415 Gestational diabetes mellitus in pregnancy, controlled by oral hypoglycemic drugs: Secondary | ICD-10-CM

## 2017-05-05 ENCOUNTER — Ambulatory Visit (INDEPENDENT_AMBULATORY_CARE_PROVIDER_SITE_OTHER): Payer: Self-pay | Admitting: Obstetrics and Gynecology

## 2017-05-05 VITALS — BP 123/72 | HR 104 | Wt 223.5 lb

## 2017-05-05 DIAGNOSIS — R945 Abnormal results of liver function studies: Secondary | ICD-10-CM

## 2017-05-05 DIAGNOSIS — R7989 Other specified abnormal findings of blood chemistry: Secondary | ICD-10-CM

## 2017-05-05 DIAGNOSIS — O24419 Gestational diabetes mellitus in pregnancy, unspecified control: Secondary | ICD-10-CM

## 2017-05-05 DIAGNOSIS — O0993 Supervision of high risk pregnancy, unspecified, third trimester: Secondary | ICD-10-CM

## 2017-05-05 DIAGNOSIS — O09523 Supervision of elderly multigravida, third trimester: Secondary | ICD-10-CM

## 2017-05-05 NOTE — Progress Notes (Signed)
Prenatal Visit Note Date: 05/05/2017 Clinic: [facility]  Subjective:  Vicki Roberts is a 36 y.o. [phone] at 60w4dbeing seen today for ongoing prenatal care.  She is currently monitored for the following issues for this high-risk pregnancy and has Elevated liver function tests; BMI 40.0-44.9, adult ([place]; Advanced maternal age in multigravida; Supervision of high-risk pregnancy; Obesity affecting pregnancy in second trimester; GDM, class A2; and Language barrier on her problem list.  Patient reports no complaints.   Contractions: Irritability. Vag. Bleeding: None.  Movement: Present. Denies leaking of fluid.   The following portions of the patient's history were reviewed and updated as appropriate: allergies, current medications, past family history, past medical history, past social history, past surgical history and problem list. Problem list updated.  Objective:   Vitals:   05/05/17 1446  BP: 123/72  Pulse: (!) 104  Weight: 223 lb 8 oz (101.4 kg)    Fetal Status: Fetal Heart Rate (bpm): 147   Movement: Present     General:  Alert, oriented and cooperative. Patient is in no acute distress.  Skin: Skin is warm and dry. No rash noted.   Cardiovascular: Normal heart rate noted  Respiratory: Normal respiratory effort, no problems with respiration noted  Abdomen: Soft, gravid, appropriate for gestational age. Pain/Pressure: Present     Pelvic:  Cervical exam deferred        Extremities: Normal range of motion.  Edema: None  Mental Status: Normal mood and affect. Normal behavior. Normal judgment and thought content.   Urinalysis:      Assessment and Plan:  Pregnancy: G4P3003 at 374w4d1. GDM, class A2 On metformin 500/1000. Normal BS log. Normal efw/ac/afi on 6/12. Has rpt for one month. D/w pt to start 2x/week testing at 32wks.  - RPR - HIV antibody (with reflex) - CBC - Comp Met (CMET)   Preterm labor symptoms and general obstetric precautions  including but not limited to vaginal bleeding, contractions, leaking of fluid and fetal movement were reviewed in detail with the patient. Please refer to After Visit Summary for other counseling recommendations.  Return in about 2 weeks (around 05/19/2017) for start 2x/week testing then. rob. . Aletha HalimMD

## 2017-05-05 NOTE — Progress Notes (Signed)
Vicki Roberts

## 2017-05-06 LAB — CBC
HEMOGLOBIN: 11.2 g/dL (ref 11.1–15.9)
Hematocrit: 34.2 % (ref 34.0–46.6)
MCH: 27.7 pg (ref 26.6–33.0)
MCHC: 32.7 g/dL (ref 31.5–35.7)
MCV: 85 fL (ref 79–97)
PLATELETS: 376 10*3/uL (ref 150–379)
RBC: 4.04 x10E6/uL (ref 3.77–5.28)
RDW: 14.4 % (ref 12.3–15.4)
WBC: 8.7 10*3/uL (ref 3.4–10.8)

## 2017-05-06 LAB — RPR: RPR: NONREACTIVE

## 2017-05-06 LAB — COMPREHENSIVE METABOLIC PANEL
A/G RATIO: 1.1 — AB (ref 1.2–2.2)
ALBUMIN: 3.3 g/dL — AB (ref 3.5–5.5)
ALT: 54 IU/L — ABNORMAL HIGH (ref 0–32)
AST: 26 IU/L (ref 0–40)
Alkaline Phosphatase: 82 IU/L (ref 39–117)
BILIRUBIN TOTAL: 0.3 mg/dL (ref 0.0–1.2)
BUN / CREAT RATIO: 23 (ref 9–23)
BUN: 10 mg/dL (ref 6–20)
CHLORIDE: 103 mmol/L (ref 96–106)
CO2: 16 mmol/L — ABNORMAL LOW (ref 20–29)
Calcium: 8.8 mg/dL (ref 8.7–10.2)
Creatinine, Ser: 0.44 mg/dL — ABNORMAL LOW (ref 0.57–1.00)
GFR calc non Af Amer: 130 mL/min/{1.73_m2} (ref >59)
GFR, EST AFRICAN AMERICAN: 150 mL/min/{1.73_m2} (ref >59)
Globulin, Total: 3.1 g/dL (ref 1.5–4.5)
Glucose: 117 mg/dL — ABNORMAL HIGH (ref 65–99)
POTASSIUM: 4.1 mmol/L (ref 3.5–5.2)
SODIUM: 137 mmol/L (ref 134–144)
TOTAL PROTEIN: 6.4 g/dL (ref 6.0–8.5)

## 2017-05-06 LAB — HIV ANTIBODY (ROUTINE TESTING W REFLEX): HIV Screen 4th Generation wRfx: NONREACTIVE

## 2017-05-17 ENCOUNTER — Telehealth: Payer: Self-pay

## 2017-05-17 NOTE — Telephone Encounter (Signed)
-----   Message from Jonathon BellowsBeronica Mendez sent at 05/17/2017  9:35 AM EDT ----- Patient requesting call back. She has several questions and has been vomiting for past few days.   Please call patient back with interpreter.   THANKS!

## 2017-05-17 NOTE — Telephone Encounter (Signed)
Called patient se reports some vomiting and diarrhea over the weekend. She reports the vomiting stop but the diarrhea has not. I have advised patient to continued to monitor her symptoms she may have a stomach virus. She should take small sips of clear liquid thought out the day. If the vomiting starts back up she should call us back. Informed patient these stomach last for a couple days. She should stay away from milk products until this passes. Patient voice understanding uses pacific interpreter.

## 2017-05-24 ENCOUNTER — Ambulatory Visit (INDEPENDENT_AMBULATORY_CARE_PROVIDER_SITE_OTHER): Payer: Self-pay | Admitting: Obstetrics & Gynecology

## 2017-05-24 VITALS — BP 132/75 | HR 97 | Wt 223.3 lb

## 2017-05-24 DIAGNOSIS — O09523 Supervision of elderly multigravida, third trimester: Secondary | ICD-10-CM

## 2017-05-24 DIAGNOSIS — O24419 Gestational diabetes mellitus in pregnancy, unspecified control: Secondary | ICD-10-CM

## 2017-05-24 DIAGNOSIS — O0993 Supervision of high risk pregnancy, unspecified, third trimester: Secondary | ICD-10-CM

## 2017-05-24 NOTE — Progress Notes (Signed)
   PRENATAL VISIT NOTE  Subjective:  Vicki Roberts is a 36 y.o. [phone] at 7769w2d being seen today for ongoing prenatal care.  She is currently monitored for the following issues for this high-risk pregnancy and has Elevated liver function tests; BMI 40.0-44.9, adult ([facility]); Advanced maternal age in multigravida; Supervision of high-risk pregnancy; Obesity affecting pregnancy in second trimester; GDM, class A2; and Language barrier on her problem list.  Patient reports no complaints.  Contractions: Irregular. Vag. Bleeding: None.  Movement: Present. Denies leaking of fluid.   The following portions of the patient's history were reviewed and updated as appropriate: allergies, current medications, past family history, past medical history, past social history, past surgical history and problem list. Problem list updated.  Objective:   Vitals:   05/24/17 1500  BP: 132/75  Pulse: 97  Weight: 223 lb 4.8 oz (101.3 kg)    Fetal Status:     Movement: Present     General:  Alert, oriented and cooperative. Patient is in no acute distress.  Skin: Skin is warm and dry. No rash noted.   Cardiovascular: Normal heart rate noted  Respiratory: Normal respiratory effort, no problems with respiration noted  Abdomen: Soft, gravid, appropriate for gestational age. Pain/Pressure: Absent     Pelvic:  Cervical exam deferred        Extremities: Normal range of motion.  Edema: None  Mental Status: Normal mood and affect. Normal behavior. Normal judgment and thought content.   Assessment and Plan:  Pregnancy: G4P3003 at 3569w2d  1. GDM, class A2 NST reactive today - Fetal nonstress test - US MFM FETAL BPP WO NON STRESS; Future  2. Supervision of high risk pregnancy in third trimester BG wnl on metformin  3. Elderly multigravida in third trimester F/u US tomorrow  Preterm labor symptoms and general obstetric precautions including but not limited to vaginal bleeding, contractions, leaking of fluid  and fetal movement were reviewed in detail with the patient. Please refer to After Visit Summary for other counseling recommendations.  Return in about 1 week (around 05/31/2017) for NST/BPP; 2 weeks Ob fu and NST/BPP.   Scheryl DarterJames Sira Adsit, MD

## 2017-05-24 NOTE — Patient Instructions (Signed)
Tercer trimestre de embarazo (Third Trimester of Pregnancy) El tercer trimestre comprende desde la semana29 hasta la semana42, es decir, desde el mes7 hasta el mes9. El tercer trimestre es un perodo en el que el feto crece rpidamente. Hacia el final del noveno mes, el feto mide alrededor de 20pulgadas (45cm) de largo y pesa entre 6 y 10 libras (2,700 y 4,500kg). CAMBIOS EN EL ORGANISMO Su organismo atraviesa por muchos cambios durante el embarazo, y estos varan de una mujer a otra.  Seguir aumentando de peso. Es de esperar que aumente entre 25 y 35libras (11 y 16kg) hacia el final del embarazo.  Podrn aparecer las primeras estras en las caderas, el abdomen y las mamas.  Puede tener necesidad de orinar con ms frecuencia porque el feto baja hacia la pelvis y ejerce presin sobre la vejiga.  Debido al embarazo podr sentir acidez estomacal con frecuencia.  Puede estar estreida, ya que ciertas hormonas enlentecen los movimientos de los msculos que empujan los desechos a travs de los intestinos.  Pueden aparecer hemorroides o abultarse e hincharse las venas (venas varicosas).  Puede sentir dolor plvico debido al aumento de peso y a que las hormonas del embarazo relajan las articulaciones entre los huesos de la pelvis. El dolor de espalda puede ser consecuencia de la sobrecarga de los msculos que soportan la postura.  Tal vez haya cambios en el cabello que pueden incluir su engrosamiento, crecimiento rpido y cambios en la textura. Adems, a algunas mujeres se les cae el cabello durante o despus del embarazo, o tienen el cabello seco o fino. Lo ms probable es que el cabello se le normalice despus del nacimiento del beb.  Las mamas seguirn creciendo y le dolern. A veces, puede haber una secrecin amarilla de las mamas llamada calostro.  El ombligo puede salir hacia afuera.  Puede sentir que le falta el aire debido a que se expande el tero.  Puede notar que el feto  "baja" o lo siente ms bajo, en el abdomen.  Puede tener una prdida de secrecin mucosa con sangre. Esto suele ocurrir en el trmino de unos pocos das a una semana antes de que comience el trabajo de parto.  El cuello del tero se vuelve delgado y blando (se borra) cerca de la fecha de parto. QU DEBE ESPERAR EN LOS EXMENES PRENATALES Le harn exmenes prenatales cada 2semanas hasta la semana36. A partir de ese momento le harn exmenes semanales. Durante una visita prenatal de rutina:  La pesarn para asegurarse de que usted y el feto estn creciendo normalmente.  Le tomarn la presin arterial.  Le medirn el abdomen para controlar el desarrollo del beb.  Se escucharn los latidos cardacos fetales.  Se evaluarn los resultados de los estudios solicitados en visitas anteriores.  Le revisarn el cuello del tero cuando est prxima la fecha de parto para controlar si este se ha borrado. Alrededor de la semana36, el mdico le revisar el cuello del tero. Al mismo tiempo, realizar un anlisis de las secreciones del tejido vaginal. Este examen es para determinar si hay un tipo de bacteria, estreptococo Grupo B. El mdico le explicar esto con ms detalle. El mdico puede preguntarle lo siguiente:  Cmo le gustara que fuera el parto.  Cmo se siente.  Si siente los movimientos del beb.  Si ha tenido sntomas anormales, como prdida de lquido, sangrado, dolores de cabeza intensos o clicos abdominales.  Si est consumiendo algn producto que contenga tabaco, como cigarrillos, tabaco de mascar y   cigarrillos electrnicos.  Si tiene alguna pregunta. Otros exmenes o estudios de deteccin que pueden realizarse durante el tercer trimestre incluyen lo siguiente:  Anlisis de sangre para controlar los niveles de hierro (anemia).  Controles fetales para determinar su salud, nivel de actividad y crecimiento. Si tiene alguna enfermedad o hay problemas durante el embarazo, le harn  estudios.  Prueba del VIH (virus de inmunodeficiencia humana). Si corre un riesgo alto, pueden realizarle una prueba de deteccin del VIH durante el tercer trimestre del embarazo. FALSO TRABAJO DE PARTO Es posible que sienta contracciones leves e irregulares que finalmente desaparecen. Se llaman contracciones de Braxton Hicks o falso trabajo de parto. Las contracciones pueden durar horas, das o incluso semanas, antes de que el verdadero trabajo de parto se inicie. Si las contracciones ocurren a intervalos regulares, se intensifican o se hacen dolorosas, lo mejor es que la revise el mdico. SIGNOS DE TRABAJO DE PARTO  Clicos de tipo menstrual.  Contracciones cada 5minutos o menos.  Contracciones que comienzan en la parte superior del tero y se extienden hacia abajo, a la zona inferior del abdomen y la espalda.  Sensacin de mayor presin en la pelvis o dolor de espalda.  Una secrecin de mucosidad acuosa o con sangre que sale de la vagina. Si tiene alguno de estos signos antes de la semana37 del embarazo, llame a su mdico de inmediato. Debe concurrir al hospital para que la controlen inmediatamente. INSTRUCCIONES PARA EL CUIDADO EN EL HOGAR  Evite fumar, consumir hierbas, beber alcohol y tomar frmacos que no le hayan recetado. Estas sustancias qumicas afectan la formacin y el desarrollo del beb.  No consuma ningn producto que contenga tabaco, lo que incluye cigarrillos, tabaco de mascar y cigarrillos electrnicos. Si necesita ayuda para dejar de fumar, consulte al mdico. Puede recibir asesoramiento y otro tipo de recursos para dejar de fumar.  Siga las indicaciones del mdico en relacin con el uso de medicamentos. Durante el embarazo, hay medicamentos que son seguros de tomar y otros que no.  Haga ejercicio solamente como se lo haya indicado el mdico. Sentir clicos uterinos es un buen signo para detener la actividad fsica.  Contine comiendo alimentos sanos con  regularidad.  Use un sostn que le brinde buen soporte si le duelen las mamas.  No se d baos de inmersin en agua caliente, baos turcos ni saunas.  Use el cinturn de seguridad en todo momento mientras conduce.  No coma carne cruda ni queso sin cocinar; evite el contacto con las bandejas sanitarias de los gatos y la tierra que estos animales usan. Estos elementos contienen grmenes que pueden causar defectos congnitos en el beb.  Tome las vitaminas prenatales.  Tome entre 1500 y 2000mg de calcio diariamente comenzando en la semana20 del embarazo hasta el parto.  Si est estreida, pruebe un laxante suave (si el mdico lo autoriza). Consuma ms alimentos ricos en fibra, como vegetales y frutas frescos y cereales integrales. Beba gran cantidad de lquido para mantener la orina de tono claro o color amarillo plido.  Dese baos de asiento con agua tibia para aliviar el dolor o las molestias causadas por las hemorroides. Use una crema para las hemorroides si el mdico la autoriza.  Si tiene venas varicosas, use medias de descanso. Eleve los pies durante 15minutos, 3 o 4veces por da. Limite el consumo de sal en su dieta.  Evite levantar objetos pesados, use zapatos de tacones bajos y mantenga una buena postura.  Descanse con las piernas elevadas si tiene   calambres o dolor de cintura.  Visite a su dentista si no lo ha hecho durante el embarazo. Use un cepillo de dientes blando para higienizarse los dientes y psese el hilo dental con suavidad.  Puede seguir manteniendo relaciones sexuales, a menos que el mdico le indique lo contrario.  No haga viajes largos excepto que sea absolutamente necesario y solo con la autorizacin del mdico.  Tome clases prenatales para entender, practicar y hacer preguntas sobre el trabajo de parto y el parto.  Haga un ensayo de la partida al hospital.  Prepare el bolso que llevar al hospital.  Prepare la habitacin del beb.  Concurra a todas  las visitas prenatales segn las indicaciones de su mdico.  SOLICITE ATENCIN MDICA SI:  No est segura de que est en trabajo de parto o de que ha roto la bolsa de las aguas.  Tiene mareos.  Siente clicos leves, presin en la pelvis o dolor persistente en el abdomen.  Tiene nuseas, vmitos o diarrea persistentes.  Observa una secrecin vaginal con mal olor.  Siente dolor al orinar.  SOLICITE ATENCIN MDICA DE INMEDIATO SI:  Tiene fiebre.  Tiene una prdida de lquido por la vagina.  Tiene sangrado o pequeas prdidas vaginales.  Siente dolor intenso o clicos en el abdomen.  Sube o baja de peso rpidamente.  Tiene dificultad para respirar y siente dolor de pecho.  Sbitamente se le hinchan mucho el rostro, las manos, los tobillos, los pies o las piernas.  No ha sentido los movimientos del beb durante una hora.  Siente un dolor de cabeza intenso que no se alivia con medicamentos.  Su visin se modifica.  Esta informacin no tiene como fin reemplazar el consejo del mdico. Asegrese de hacerle al mdico cualquier pregunta que tenga. Document Released: 08/12/2005 Document Revised: 11/23/2014 Document Reviewed: 01/03/2013 Elsevier Interactive Patient Education  2017 Elsevier Inc.  

## 2017-05-24 NOTE — Progress Notes (Signed)
US for growth tomorrow, BPP added.

## 2017-05-25 ENCOUNTER — Ambulatory Visit (HOSPITAL_COMMUNITY)
Admission: RE | Admit: 2017-05-25 | Discharge: 2017-05-25 | Disposition: A | Discharge status: Home / Self Care | Payer: Self-pay | Source: Ambulatory Visit | Attending: Obstetrics and Gynecology | Admitting: Obstetrics and Gynecology

## 2017-05-25 ENCOUNTER — Encounter (HOSPITAL_COMMUNITY): Payer: Self-pay

## 2017-05-25 DIAGNOSIS — O24419 Gestational diabetes mellitus in pregnancy, unspecified control: Secondary | ICD-10-CM

## 2017-05-25 DIAGNOSIS — Z3A33 33 weeks gestation of pregnancy: Secondary | ICD-10-CM | POA: Insufficient documentation

## 2017-05-25 DIAGNOSIS — Z6841 Body Mass Index (BMI) 40.0 and over, adult: Secondary | ICD-10-CM | POA: Insufficient documentation

## 2017-05-25 DIAGNOSIS — O0993 Supervision of high risk pregnancy, unspecified, third trimester: Secondary | ICD-10-CM

## 2017-05-25 DIAGNOSIS — O24415 Gestational diabetes mellitus in pregnancy, controlled by oral hypoglycemic drugs: Secondary | ICD-10-CM | POA: Insufficient documentation

## 2017-05-25 DIAGNOSIS — O09523 Supervision of elderly multigravida, third trimester: Secondary | ICD-10-CM | POA: Insufficient documentation

## 2017-05-25 DIAGNOSIS — O99213 Obesity complicating pregnancy, third trimester: Secondary | ICD-10-CM | POA: Insufficient documentation

## 2017-05-26 ENCOUNTER — Other Ambulatory Visit (HOSPITAL_COMMUNITY): Payer: Self-pay | Admitting: *Deleted

## 2017-05-26 DIAGNOSIS — O24415 Gestational diabetes mellitus in pregnancy, controlled by oral hypoglycemic drugs: Secondary | ICD-10-CM

## 2017-05-31 ENCOUNTER — Ambulatory Visit (INDEPENDENT_AMBULATORY_CARE_PROVIDER_SITE_OTHER): Payer: Self-pay | Admitting: *Deleted

## 2017-05-31 ENCOUNTER — Ambulatory Visit: Payer: Self-pay

## 2017-05-31 VITALS — BP 110/62 | HR 91

## 2017-05-31 DIAGNOSIS — O24419 Gestational diabetes mellitus in pregnancy, unspecified control: Secondary | ICD-10-CM

## 2017-05-31 NOTE — Progress Notes (Signed)
Pt informed that the ultrasound is considered a limited OB ultrasound and is not intended to be a complete ultrasound exam.  Patient also informed that the ultrasound is not being completed with the intent of assessing for fetal or placental anomalies or any pelvic abnormalities.  Explained that the purpose of today's ultrasound is to assess for presentation and amniotic fluid volume.  Patient acknowledges the purpose of the exam and the limitations of the study.    Interpreter Maretta LosBlanca Lindner present for encounter.

## 2017-06-04 ENCOUNTER — Ambulatory Visit (INDEPENDENT_AMBULATORY_CARE_PROVIDER_SITE_OTHER): Payer: Self-pay | Admitting: Obstetrics & Gynecology

## 2017-06-04 VITALS — BP 110/67 | HR 86

## 2017-06-04 DIAGNOSIS — O24419 Gestational diabetes mellitus in pregnancy, unspecified control: Secondary | ICD-10-CM

## 2017-06-07 ENCOUNTER — Ambulatory Visit (INDEPENDENT_AMBULATORY_CARE_PROVIDER_SITE_OTHER): Payer: Self-pay | Admitting: *Deleted

## 2017-06-07 ENCOUNTER — Ambulatory Visit (INDEPENDENT_AMBULATORY_CARE_PROVIDER_SITE_OTHER): Payer: Self-pay | Admitting: Obstetrics and Gynecology

## 2017-06-07 ENCOUNTER — Ambulatory Visit: Payer: Self-pay

## 2017-06-07 ENCOUNTER — Encounter: Payer: Self-pay | Admitting: Obstetrics and Gynecology

## 2017-06-07 VITALS — BP 118/70 | HR 121 | Wt 224.8 lb

## 2017-06-07 DIAGNOSIS — O09523 Supervision of elderly multigravida, third trimester: Secondary | ICD-10-CM

## 2017-06-07 DIAGNOSIS — O24419 Gestational diabetes mellitus in pregnancy, unspecified control: Secondary | ICD-10-CM

## 2017-06-07 DIAGNOSIS — Z789 Other specified health status: Secondary | ICD-10-CM

## 2017-06-07 DIAGNOSIS — O0993 Supervision of high risk pregnancy, unspecified, third trimester: Secondary | ICD-10-CM

## 2017-06-07 DIAGNOSIS — R7989 Other specified abnormal findings of blood chemistry: Secondary | ICD-10-CM

## 2017-06-07 DIAGNOSIS — R945 Abnormal results of liver function studies: Secondary | ICD-10-CM

## 2017-06-07 LAB — POCT URINALYSIS DIP (DEVICE)
Glucose, UA: NEGATIVE mg/dL
Hgb urine dipstick: NEGATIVE
KETONES UR: NEGATIVE mg/dL
Nitrite: NEGATIVE
PH: 6 (ref 5.0–8.0)
PROTEIN: 30 mg/dL — AB
SPECIFIC GRAVITY, URINE: 1.025 (ref 1.005–1.030)
Urobilinogen, UA: 1 mg/dL (ref 0.0–1.0)

## 2017-06-07 NOTE — Progress Notes (Signed)
Subjective:  Vicki Roberts is a 36 y.o. [phone] at 21w2dbeing seen today for ongoing prenatal care.  She is currently monitored for the following issues for this high-risk pregnancy and has Elevated liver function tests; BMI 40.0-44.9, adult ([place]; Advanced maternal age in multigravida; Supervision of high-risk pregnancy; Obesity affecting pregnancy in second trimester; GDM, class A2; and Language barrier on her problem list.  Patient reports burning senastion in her left breast. .  Contractions: Not present. Vag. Bleeding: None.  Movement: Present. Denies leaking of fluid.   The following portions of the patient's history were reviewed and updated as appropriate: allergies, current medications, past family history, past medical history, past social history, past surgical history and problem list. Problem list updated.  Objective:   Vitals:   06/07/17 1257  BP: 118/70  Pulse: (!) 121  Weight: 224 lb 12.8 oz (102 kg)    Fetal Status: Fetal Heart Rate (bpm): NST   Movement: Present     General:  Alert, oriented and cooperative. Patient is in no acute distress.  Skin: Skin is warm and dry. No rash noted.   Cardiovascular: Normal heart rate noted  Respiratory: Normal respiratory effort, no problems with respiration noted  Abdomen: Soft, gravid, appropriate for gestational age. Pain/Pressure: Present     Pelvic:  Cervical exam deferred        Extremities: Normal range of motion.     Mental Status: Normal mood and affect. Normal behavior. Normal judgment and thought content.   Urinalysis: Urine Protein: 1+ Urine Glucose: Negative  Assessment and Plan:  Pregnancy: G4P3003 at 322w2d1. Supervision of high risk pregnancy in third trimester Stable  2. GDM, class A2 BS in goal range for most part. BPP 10/10 Continue with antenatal testing. U/S 05/25/17 33 % 1898 gm F/U scheduled - [place] FETAL BPP WO NON STRESS; Future  3. Elevated liver function tests  - Comp Met (CMET)  4.  Elderly multigravida in third trimester   5. Language barrier Interrupter services used today  Preterm labor symptoms and general obstetric precautions including but not limited to vaginal bleeding, contractions, leaking of fluid and fetal movement were reviewed in detail with the patient. Please refer to After Visit Summary for other counseling recommendations.  Return in about 2 weeks (around 06/21/2017) for Ob fu and NST (has [place] 14[place]) , OB visit.   ErChancy MilroyMD

## 2017-06-07 NOTE — Progress Notes (Signed)

## 2017-06-07 NOTE — Progress Notes (Signed)
Pt beginning weekly BPP's today - next scheduled on 7/30. US for growth scheduled 8/7, BPP will be added.

## 2017-06-08 LAB — COMPREHENSIVE METABOLIC PANEL
ALK PHOS: 109 IU/L (ref 39–117)
ALT: 53 IU/L — AB (ref 0–32)
AST: 32 IU/L (ref 0–40)
Albumin/Globulin Ratio: 1 — ABNORMAL LOW (ref 1.2–2.2)
Albumin: 3.4 g/dL — ABNORMAL LOW (ref 3.5–5.5)
BILIRUBIN TOTAL: 0.4 mg/dL (ref 0.0–1.2)
BUN/Creatinine Ratio: 12 (ref 9–23)
BUN: 7 mg/dL (ref 6–20)
CHLORIDE: 103 mmol/L (ref 96–106)
CO2: 20 mmol/L (ref 20–29)
CREATININE: 0.59 mg/dL (ref 0.57–1.00)
Calcium: 9.3 mg/dL (ref 8.7–10.2)
GFR calc Af Amer: 136 mL/min/{1.73_m2} (ref >59)
GFR calc non Af Amer: 118 mL/min/{1.73_m2} (ref >59)
GLUCOSE: 93 mg/dL (ref 65–99)
Globulin, Total: 3.4 g/dL (ref 1.5–4.5)
Potassium: 4.4 mmol/L (ref 3.5–5.2)
Sodium: 137 mmol/L (ref 134–144)
Total Protein: 6.8 g/dL (ref 6.0–8.5)

## 2017-06-14 ENCOUNTER — Ambulatory Visit (INDEPENDENT_AMBULATORY_CARE_PROVIDER_SITE_OTHER): Payer: Self-pay | Admitting: Obstetrics & Gynecology

## 2017-06-14 ENCOUNTER — Ambulatory Visit (INDEPENDENT_AMBULATORY_CARE_PROVIDER_SITE_OTHER): Payer: Self-pay | Admitting: *Deleted

## 2017-06-14 ENCOUNTER — Ambulatory Visit: Payer: Self-pay

## 2017-06-14 VITALS — BP 117/79 | HR 102 | Wt 225.7 lb

## 2017-06-14 DIAGNOSIS — O24419 Gestational diabetes mellitus in pregnancy, unspecified control: Secondary | ICD-10-CM

## 2017-06-14 DIAGNOSIS — Z113 Encounter for screening for infections with a predominantly sexual mode of transmission: Secondary | ICD-10-CM

## 2017-06-14 DIAGNOSIS — O0993 Supervision of high risk pregnancy, unspecified, third trimester: Secondary | ICD-10-CM

## 2017-06-14 DIAGNOSIS — A048 Other specified bacterial intestinal infections: Secondary | ICD-10-CM | POA: Insufficient documentation

## 2017-06-14 DIAGNOSIS — L309 Dermatitis, unspecified: Secondary | ICD-10-CM | POA: Insufficient documentation

## 2017-06-14 LAB — POCT URINALYSIS DIP (DEVICE)
BILIRUBIN URINE: NEGATIVE
GLUCOSE, UA: NEGATIVE mg/dL
KETONES UR: NEGATIVE mg/dL
Nitrite: NEGATIVE
Protein, ur: NEGATIVE mg/dL
SPECIFIC GRAVITY, URINE: 1.025 (ref 1.005–1.030)
UROBILINOGEN UA: 0.2 mg/dL (ref 0.0–1.0)
pH: 5.5 (ref 5.0–8.0)

## 2017-06-14 LAB — OB RESULTS CONSOLE GBS: STREP GROUP B AG: POSITIVE

## 2017-06-14 NOTE — Progress Notes (Signed)
   PRENATAL VISIT NOTE  Subjective:  Vicki Roberts is a 36 y.o. [phone] at 5993w2d being seen today for ongoing prenatal care.  She is currently monitored for the following issues for this high-risk pregnancy and has Elevated liver function tests; BMI 40.0-44.9, adult ([facility]); Advanced maternal age in multigravida; Supervision of high-risk pregnancy; Obesity affecting pregnancy in second trimester; GDM, class A2; Language barrier; Eczema; and Helicobacter pylori gastrointestinal tract infection on her problem list.  Patient reports no complaints.  Contractions: Irregular. Vag. Bleeding: None.  Movement: Present. Denies leaking of fluid.   The following portions of the patient's history were reviewed and updated as appropriate: allergies, current medications, past family history, past medical history, past social history, past surgical history and problem list. Problem list updated.  Objective:   Vitals:   06/14/17 1021  BP: 117/79  Pulse: (!) 102  Weight: 225 lb 11.2 oz (102.4 kg)    Fetal Status:     Movement: Present     General:  Alert, oriented and cooperative. Patient is in no acute distress.  Skin: Skin is warm and dry. No rash noted.   Cardiovascular: Normal heart rate noted  Respiratory: Normal respiratory effort, no problems with respiration noted  Abdomen: Soft, gravid, appropriate for gestational age.  Pain/Pressure: Present     Pelvic: Cervical exam performed      1.5/th/bal  Extremities: Normal range of motion.  Edema: None  Mental Status:  Normal mood and affect. Normal behavior. Normal judgment and thought content.   Assessment and Plan:  Pregnancy: G4P3003 at 2993w2d  1. Supervision of high risk pregnancy in third trimester Birth Control information reviewed and given.   Cultures today  2. GDM, class A2 Two elevated fastings this past week.  All pp are nml. NST reactive.  Preterm labor symptoms and general obstetric precautions including but not limited to  vaginal bleeding, contractions, leaking of fluid and fetal movement were reviewed in detail with the patient. Please refer to After Visit Summary for other counseling recommendations.   MD in 1 week.  Continue testing with induction at 39 weeks.  Elsie LincolnKelly Terena Bohan, MD

## 2017-06-14 NOTE — Addendum Note (Signed)
Addended by: Jill SideAY, Monaye Blackie L on: 06/14/2017 11:58 AM   Modules accepted: Orders

## 2017-06-14 NOTE — Patient Instructions (Signed)
Eleccin del mtodo anticonceptivo (Contraception Choices) La anticoncepcin (control de la natalidad) es el uso de cualquier mtodo o dispositivo para evitar el embarazo. A continuacin se indican algunos de esos mtodos. MTODOS HORMONALES  El Implante contraconceptivo consiste en un tubo plstico delgado que contiene la hormona progesterona. No contiene estrgenos. El mdico inserta el tubo en la parte interna del brazo. El tubo puede permanecer en el lugar durante 3 aos. Despus de los 3 aos debe retirarse. El implante impide que los ovarios liberen vulos (ovulacin), espesa el moco cervical, lo que evita que los espermatozoides ingresen al tero y hace ms delgada la membrana que cubre el interior del tero.  Inyecciones de progesterona sola: las administra el mdico cada 3 meses para evitar el embarazo. La progesterona sinttica impide que los ovarios liberen vulos. Tambin hacen que el moco cervical se espese y modifique el tejido de recubrimiento interno del tero. Esto hace ms difcil que los espermatozoides sobrevivan en el tero.  Las pldoras anticonceptivas contienen estrgenos y progesterona. Su funcin es evitar que los ovarios liberen vulos (ovulacin). Las hormonas de los anticonceptivos orales hacen que el moco cervical se haga ms espeso, lo que evita que el esperma ingrese al tero. Las pldoras anticonceptivas son recetadas por el mdico.Tambin se utilizan para tratar los perodos menstruales abundantes.  Minipldora: este tipo de pldora anticonceptiva contiene slo hormona progesterona. Deben tomarse todos los das del mes y debe recetarlas el mdico.  El parche de control de natalidad: contiene hormonas similares a las que contienen las pldoras anticonceptivas. Deben cambiarse una vez por semana y se utilizan bajo prescripcin mdica.  Anillo vaginal: contiene hormonas similares a las que contienen las pldoras anticonceptivas. Se deja colocado durante tres semanas, se  lo retira durante 1 semana y luego se coloca uno nuevo. La paciente debe sentirse cmoda al insertar y retirar el anillo de la vagina.Es necesaria la prescripcin mdica.  Anticonceptivos de emergencia: son mtodos para evitar un embarazo despus de una relacin sexual sin proteccin. Esta pldora puede tomarse inmediatamente despus de tener relaciones sexuales o hasta 5 das de haber tenido sexo sin proteccin. Es ms efectiva si se toma poco tiempo despus de la relacin sexual. Los anticonceptivos de emergencia estn disponibles sin prescripcin mdica. Consltelo con su farmacutico. No use los anticonceptivos de emergencia como nico mtodo anticonceptivo.  MTODOS DE BARRERA  Condn masculino: es una vaina delgada (ltex o goma) que se coloca cubriendo al pene durante el acto sexual. Puede usarse con espermicida para aumentar la efectividad.  Condn femenino. Es una funda delicada y blanda que se adapta holgadamente a la vagina antes de las relaciones sexuales.  Diafragma: es una barrera de ltex redonda y suave que debe ser recomendado por un profesional. Se inserta en la vagina, junto con un gel espermicida. Debe insertarse antes de tener relaciones sexuales. Debe dejar el diafragma colocado en la vagina durante 6 a 8 horas despus de la relacin sexual.  Capuchn cervical: es una barrera de ltex o taza plstica redonda y suave que cubre el cuello del tero y debe ser colocada por un mdico. Puede dejarlo colocado en la vagina hasta 48 horas despus de las relaciones sexuales.  Esponja: es una pieza blanda y circular de espuma de poliuretano. Contiene un espermicida. Se inserta en la vagina despus de mojarla y antes de las relaciones sexuales.  Espermicidas: son sustancias qumicas que matan o bloquean al esperma y no lo dejan ingresar al cuello del tero y al tero. Vienen en   forma de cremas, geles, supositorios, espuma o comprimidos. No es necesario tener receta mdica. Se insertan en  la vagina con un aplicador antes de tener relaciones sexuales. El proceso debe repetirse cada vez que tiene relaciones sexuales.  ANTICONCEPTIVOS INTRAUTERINOS  Dispositivo intrauterino (DIU) es un dispositivo en forma de T que se coloca en el tero durante el perodo menstrual, para evitar el embarazo. Hay dos tipos: ? DIU de cobre: este tipo de DIU est recubierto con un alambre de cobre y se inserta dentro del tero. El cobre hace que el tero y las trompas de Falopio produzcan un liquido que destruye los espermatozoides. Puede permanecer colocado durante 10 aos. ? DIU con hormona: este tipo de DIU contiene la hormona progestina (progesterona sinttica). La hormona espesa el moco cervical y evita que los espermatozoides ingresen al tero y tambin afina la membrana que cubre el tero para evitar la implantacin del vulo fertilizado. La hormona debilita o destruye los espermatozoides que ingresan al tero. Puede permanecer en el lugar durante 3-5 aos, segn el tipo de DIU que se utilice.  MTODOS ANTICONCEPTIVOS PERMANENTES  Ligadura de trompas en la mujer: se realiza sellando, atando u obstruyendo quirrgicamente las trompas de Falopio lo que impide que el vulo descienda hacia el tero.  Esterilizacin histeroscpica: Implica la colocacin de un pequeo espiral o la insercin en cada trompa de Falopio. El mdico utiliza una tcnica llamada histeroscopa para realizar este procedimiento. El dispositivo produce la formacin de tejido cicatrizal. Esto da como resultado una obstruccin permanente de las trompas de Falopio, de modo que la esperma no pueda fertilizar el vulo. Demora alrededor de 3 meses despus del procedimiento hasta que el conducto se obstruye. Tendr que usar otro mtodo anticonceptivo durante al menos 3 meses.  Esterilizacin masculina: se realiza ligando los conductos por los que pasan los espermatozoides (vasectoma).Esto impide que el esperma ingrese a la vagina durante el  acto sexual. Luego del procedimiento, el hombre puede eyacular lquido (semen).  MTODOS DE PLANIFICACIN NATURAL  Planificacin familiar natural: consiste en no tener relaciones sexuales o usar un mtodo de barrera (condn, diafragma, capuchn cervical) en los das que la mujer podra quedar embarazada.  Mtodo de calendario: consiste en el seguimiento de la duracin de cada ciclo menstrual y la identificacin de los perodos frtiles.  Mtodo de ovulacin: consiste en evitar las relaciones sexuales durante la ovulacin.  Mtodo sintotrmico: consiste en evitar las relaciones sexuales en la poca en la que se est ovulando, utilizando un termmetro y tendiendo en cuenta los sntomas de la ovulacin.  Mtodo postovulacin: consiste en planificar las relaciones sexuales para despus de haber ovulado. Independientemente del tipo o mtodo anticonceptivo que usted elija, es importante que use condones para protegerse contra las infecciones de transmisin sexual (ETS). Hable con su mdico con respecto a qu mtodo anticonceptivo es el ms apropiado para usted. Esta informacin no tiene como fin reemplazar el consejo del mdico. Asegrese de hacerle al mdico cualquier pregunta que tenga. Document Released: 11/02/2005 Document Revised: 07/05/2013 Document Reviewed: 04/27/2013 Elsevier Interactive Patient Education  2017 Elsevier Inc.  

## 2017-06-14 NOTE — Progress Notes (Signed)

## 2017-06-15 LAB — GC/CHLAMYDIA PROBE AMP (~~LOC~~) NOT AT ARMC
CHLAMYDIA, DNA PROBE: NEGATIVE
NEISSERIA GONORRHEA: NEGATIVE

## 2017-06-18 LAB — CULTURE, BETA STREP (GROUP B ONLY): STREP GP B CULTURE: POSITIVE — AB

## 2017-06-21 ENCOUNTER — Ambulatory Visit (INDEPENDENT_AMBULATORY_CARE_PROVIDER_SITE_OTHER): Payer: Self-pay | Admitting: Obstetrics & Gynecology

## 2017-06-21 ENCOUNTER — Other Ambulatory Visit: Payer: Self-pay | Admitting: Obstetrics & Gynecology

## 2017-06-21 ENCOUNTER — Ambulatory Visit (INDEPENDENT_AMBULATORY_CARE_PROVIDER_SITE_OTHER): Payer: Self-pay | Admitting: *Deleted

## 2017-06-21 VITALS — BP 111/67 | HR 108 | Wt 226.0 lb

## 2017-06-21 DIAGNOSIS — O0993 Supervision of high risk pregnancy, unspecified, third trimester: Secondary | ICD-10-CM

## 2017-06-21 DIAGNOSIS — O24419 Gestational diabetes mellitus in pregnancy, unspecified control: Secondary | ICD-10-CM

## 2017-06-21 MED ORDER — METFORMIN HCL 500 MG PO TABS: ORAL_TABLET | ORAL | 3 refills | Status: DC

## 2017-06-21 NOTE — Progress Notes (Signed)
   PRENATAL VISIT NOTE  Subjective:  Vicki Roberts is a 36 y.o. [phone] at 9044w2d being seen today for ongoing prenatal care.  She is currently monitored for the following issues for this high-risk pregnancy and has Elevated liver function tests; BMI 40.0-44.9, adult (HCC); Advanced maternal age in multigravida; Supervision of high-risk pregnancy; Obesity affecting pregnancy in second trimester; GDM, class A2; Language barrier; Eczema; and Helicobacter pylori gastrointestinal tract infection on her problem list.  Patient reports no complaints.  Contractions: Irregular. Vag. Bleeding: None.  Movement: Present. Denies leaking of fluid.   The following portions of the patient's history were reviewed and updated as appropriate: allergies, current medications, past family history, past medical history, past social history, past surgical history and problem list. Problem list updated.  Objective:   Vitals:   06/21/17 1402  BP: 111/67  Pulse: (!) 108  Weight: 226 lb (102.5 kg)    Fetal Status: Fetal Heart Rate (bpm): NST   Movement: Present     General:  Alert, oriented and cooperative. Patient is in no acute distress.  Skin: Skin is warm and dry. No rash noted.   Cardiovascular: Normal heart rate noted  Respiratory: Normal respiratory effort, no problems with respiration noted  Abdomen: Soft, gravid, appropriate for gestational age.  Pain/Pressure: Present     Pelvic: Cervical exam deferred        Extremities: Normal range of motion.  Edema: None  Mental Status:  Normal mood and affect. Normal behavior. Normal judgment and thought content.   Assessment and Plan:  Pregnancy: G4P3003 at 5944w2d  1. Supervision of high risk pregnancy in third trimester NST reactive today  2. GDM, class A2 IOL 39 weeks  3. Gestational diabetes mellitus (GDM), antepartum, gestational diabetes method of control unspecified FBS > 50 % above range, increase her metformin  - metFORMIN (GLUCOPHAGE) 500 MG  tablet; Two tablets by mouth in the morning and 2 in the evening  Dispense: 90 tablet; Refill: 3  Term labor symptoms and general obstetric precautions including but not limited to vaginal bleeding, contractions, leaking of fluid and fetal movement were reviewed in detail with the patient. Please refer to After Visit Summary for other counseling recommendations.  Return in about 7 days (around 06/28/2017) for NST/BPP, Ob fu.   Scheryl DarterJames Arnold, MD

## 2017-06-21 NOTE — Progress Notes (Signed)
Interpreter Nile RiggsMariel Gallego used for encounter. Pt reports yellow vaginal d/c and irritation x3 days.  US for growth and BPP tomorrow.  IOL scheduled 8/18 @ 0730.

## 2017-06-21 NOTE — Patient Instructions (Signed)
Induccin del trabajo de parto  (Labor Induction)  Se denomina induccin del trabajo de parto cuando se inician acciones para hacer que una mujer embarazada comience el trabajo de parto. La mayora de las mujeres comienzan el trabajo de parto sin ayuda entre las semanas 37 y 42 del embarazo. Cuando esto no ocurre o cuando hay una necesidad mdica, pueden utilizarse diferentes mtodos para inducirlo. La induccin del trabajo de parto hace que el tero se contraiga. Tambin hace que el cuello del tero se ablandemadure), se abra (se dilate), y se afine (se borre). Generalmente el trabajo de parto no se induce antes de las 39 semanas excepto que haya un problema con el beb o con la madre.  Antes de inducir el trabajo de parto, el mdico considerar cierto nmero de factores incluyendo los siguientes:   El estado del beb.   Cuntas semanas tiene de embarazo.   La madurez de los pulmones del beb.   El estado del cuello del tero.   La posicin del beb.  CULES SON LOS MOTIVOS PARA INDUCIR UN PARTO?  El trabajo de parto puede inducirse por las siguientes razones:   La salud del beb o de la madre estn en riesgo.   El embarazo se ha pasado de trmino en 1 semana o ms.   Ha roto la bolsa de aguas pero no se ha iniciado el trabajo de parto por s mismo.   La madre tiene algn trastorno de salud o una enfermedad grave, como hipertensin arterial, una infeccin, desprendimiento abrupto de la placenta o diabetes.   Hay escaso lquido amnitico alrededor del beb.   El beb presenta sufrimiento.  La conveniencia o el deseo de que el beb nazca en una cierta fecha no es un motivo para inducir el parto.  CULES SON LOS MTODOS UTILIZADOS PARA INDUCIR EL TRABAJO DE PARTO?  Algunos mtodos de induccin del trabajo de parto son:   Administracin del medicamentos prostaglandina. Este medicamento hace que el cuello uterino se dilate y madure. Este medicamento tambin iniciar las contracciones. Puede tomarse por  boca o insertarse en la vagina en forma de supositorio.   Insercin en la vagina de un tubo delgado (catter) con un baln en el extremo para dilatar el cuello del tero. Una vez insertado, el baln se infla con agua, lo que provoca la apertura del cuello del tero.   Ruptura de las membranas. El mdico separa el saco amnitico del cuello uterino, haciendo que el cuello uterino se distienda y cause la liberacin de la hormona llamada progesterona. Esto hace que el tero se contraiga. Este procedimiento se realiza durante una visita al consultorio mdico. Le indicarn que vuelva a su casa y espere que se inicien las contracciones. Luego tendr que volver para la induccin.   Ruptura de la bolsa de aguas. El mdico romper el saco amnitico con un pequeo instrumento. Una vez que el saco amnitico se rompe, las contracciones deben comenzar. Pueden pasar algunas horas hasta que haga efecto.   Medicamentos que desencadenen o intensifiquen las contracciones. Se lo administrarn a travs de un catter por va intravenosa (IV) que se inserta en una de las venas del brazo.  Todos los mtodos de induccin, excepto la ruptura de membranas, se realizan en el hospital. La induccin se realizar en el hospital, de modo que usted y el beb puedan ser controlados cuidadosamente.  CUNTO TIEMPO LLEVA INDUCIR EL TRABAJO DE PARTO?  Algunas inducciones pueden demorar entre 2 y 3 das. Generalmente lleva menos   tiempo, dependiendo del estado del cuello del tero. Puede tomar ms tiempo si la induccin se realiza en etapas tempranas del embarazo o es su primer embarazo. Si han pasado 2 o 3 das y no se inicia el trabajo de parto, podrn enviarla a su casa o realizar una cesrea.  CULES SON LOS RIESGOS ASOCIADOS CON LA INDUCCiN DEL TRABAJO DE PARTO?  Algunos de los riesgos de la induccin son:   Cambios en la frecuencia cardaca fetal, por ejemplo los latidos son demasiado rpidos, o lentos, o errticos.   Riesgo de distrs  fetal.   Posibilidad de infeccin en la madre o el beb.   Aumento de la posibilidad de que sea necesaria una cesrea.   Ruptura (abrupcin) de la placenta del tero (raro).   Ruptura uterina (muy raro).  Cuando es necesario realizar la induccin por razones mdicas, los beneficios deben superar a los riesgos.  CULES SON ALGUNAS RAZONES PARA NO INDUCIR EL TRABAJO DE PARTO?  La induccin no debe realizarse si:   Se demuestra que el beb no tolera el trabajo de parto.   Fue sometida anteriormente a cirugas en el tero, como una miomectoma o le han extirpado fibromas.   La placenta est en una posicin muy baja en el tero y obstruye la abertura del cuello (placenta previa).   El beb no est ubicado con la cabeza hacia bajo.   El cordn umbilical cae hacia el canal de parto, adelante del beb. Esto puede cortar el suministro de sangre y oxgeno al beb.   Fue sometida a una cesrea anteriormente.   Hay circunstancias poco habituales, como que el beb es extremadamente prematuro.  Esta informacin no tiene como fin reemplazar el consejo del mdico. Asegrese de hacerle al mdico cualquier pregunta que tenga.  Document Released: 02/09/2008 Document Revised: 02/24/2016 Document Reviewed: 06/01/2013  Elsevier Interactive Patient Education  2017 Elsevier Inc.

## 2017-06-22 ENCOUNTER — Encounter (HOSPITAL_COMMUNITY): Payer: Self-pay

## 2017-06-22 ENCOUNTER — Telehealth (HOSPITAL_COMMUNITY): Payer: Self-pay | Admitting: *Deleted

## 2017-06-22 ENCOUNTER — Ambulatory Visit (HOSPITAL_COMMUNITY)
Admission: RE | Admit: 2017-06-22 | Discharge: 2017-06-22 | Disposition: A | Discharge status: Home / Self Care | Payer: Self-pay | Source: Ambulatory Visit | Attending: Obstetrics and Gynecology | Admitting: Obstetrics and Gynecology

## 2017-06-22 DIAGNOSIS — O09523 Supervision of elderly multigravida, third trimester: Secondary | ICD-10-CM | POA: Insufficient documentation

## 2017-06-22 DIAGNOSIS — O99213 Obesity complicating pregnancy, third trimester: Secondary | ICD-10-CM | POA: Insufficient documentation

## 2017-06-22 DIAGNOSIS — Z3A37 37 weeks gestation of pregnancy: Secondary | ICD-10-CM | POA: Insufficient documentation

## 2017-06-22 DIAGNOSIS — O0993 Supervision of high risk pregnancy, unspecified, third trimester: Secondary | ICD-10-CM

## 2017-06-22 DIAGNOSIS — Z6841 Body Mass Index (BMI) 40.0 and over, adult: Secondary | ICD-10-CM | POA: Insufficient documentation

## 2017-06-22 DIAGNOSIS — O24415 Gestational diabetes mellitus in pregnancy, controlled by oral hypoglycemic drugs: Secondary | ICD-10-CM | POA: Insufficient documentation

## 2017-06-22 DIAGNOSIS — O24419 Gestational diabetes mellitus in pregnancy, unspecified control: Secondary | ICD-10-CM

## 2017-06-22 NOTE — Telephone Encounter (Signed)
Interpreter number [phone] Preadmission screen

## 2017-06-23 ENCOUNTER — Other Ambulatory Visit (HOSPITAL_COMMUNITY): Payer: Self-pay | Admitting: *Deleted

## 2017-06-23 DIAGNOSIS — O24415 Gestational diabetes mellitus in pregnancy, controlled by oral hypoglycemic drugs: Secondary | ICD-10-CM

## 2017-06-28 ENCOUNTER — Ambulatory Visit: Payer: Self-pay

## 2017-06-28 ENCOUNTER — Other Ambulatory Visit: Payer: Self-pay

## 2017-06-28 ENCOUNTER — Ambulatory Visit (INDEPENDENT_AMBULATORY_CARE_PROVIDER_SITE_OTHER): Payer: Self-pay | Admitting: Obstetrics & Gynecology

## 2017-06-28 ENCOUNTER — Ambulatory Visit (INDEPENDENT_AMBULATORY_CARE_PROVIDER_SITE_OTHER): Payer: Self-pay | Admitting: *Deleted

## 2017-06-28 VITALS — BP 122/77 | HR 95 | Wt 225.0 lb

## 2017-06-28 DIAGNOSIS — O24419 Gestational diabetes mellitus in pregnancy, unspecified control: Secondary | ICD-10-CM

## 2017-06-28 DIAGNOSIS — O0993 Supervision of high risk pregnancy, unspecified, third trimester: Secondary | ICD-10-CM

## 2017-06-28 LAB — POCT URINALYSIS DIP (DEVICE)
Bilirubin Urine: NEGATIVE
GLUCOSE, UA: NEGATIVE mg/dL
Hgb urine dipstick: NEGATIVE
KETONES UR: NEGATIVE mg/dL
Nitrite: NEGATIVE
PH: 6.5 (ref 5.0–8.0)
PROTEIN: NEGATIVE mg/dL
SPECIFIC GRAVITY, URINE: 1.015 (ref 1.005–1.030)
Urobilinogen, UA: 0.2 mg/dL (ref 0.0–1.0)

## 2017-06-28 NOTE — Progress Notes (Signed)
   PRENATAL VISIT NOTE  Subjective:  Vicki Roberts is a 36 y.o. [id number] at 2028w2d being seen today for ongoing prenatal care.  She is currently monitored for the following issues for this high-risk pregnancy and has Elevated liver function tests; BMI 40.0-44.9, adult (HCC); Advanced maternal age in multigravida; Supervision of high-risk pregnancy; Obesity affecting pregnancy in second trimester; GDM, class A2; Language barrier; Eczema; and Helicobacter pylori gastrointestinal tract infection on her problem list.  Patient reports no complaints.  Contractions: Irregular. Vag. Bleeding: None.  Movement: Present. Denies leaking of fluid.   The following portions of the patient's history were reviewed and updated as appropriate: allergies, current medications, past family history, past medical history, past social history, past surgical history and problem list. Problem list updated.  Objective:   Vitals:   06/28/17 0745  BP: 122/77  Pulse: 95  Weight: 225 lb (102.1 kg)    Fetal Status: Fetal Heart Rate (bpm): NST   Movement: Present   NST reactive  General:  Alert, oriented and cooperative. Patient is in no acute distress.  Skin: Skin is warm and dry. No rash noted.   Cardiovascular: Normal heart rate noted  Respiratory: Normal respiratory effort, no problems with respiration noted  Abdomen: Soft, gravid, appropriate for gestational age.  Pain/Pressure: Present     Pelvic: Cervical exam deferred        Extremities: Normal range of motion.     Mental Status:  Normal mood and affect. Normal behavior. Normal judgment and thought content.   Assessment and Plan:  Pregnancy: G4P3003 at 2028w2d  1. GDM, class A2 A few fastings in mid 90s.  Post prandials ok.  Continue metformin.  Induce at 39 weeks (scheduled for Saturday)  2. Supervision of high risk pregnancy in third trimester GBS negative  Term labor symptoms and general obstetric precautions including but not limited to vaginal  bleeding, contractions, leaking of fluid and fetal movement were reviewed in detail with the patient. Please refer to After Visit Summary for other counseling recommendations.  Return in about 7 weeks (around 08/16/2017) for PP appt w/2hr GTT.  IOL on 8/18.Marland Kitchen.   Elsie LincolnKelly Johara Lodwick, MD

## 2017-06-28 NOTE — Progress Notes (Signed)

## 2017-06-29 ENCOUNTER — Ambulatory Visit (HOSPITAL_COMMUNITY): Payer: Self-pay

## 2017-06-29 ENCOUNTER — Encounter (HOSPITAL_COMMUNITY): Payer: Self-pay

## 2017-07-03 ENCOUNTER — Inpatient Hospital Stay (HOSPITAL_COMMUNITY)
Admission: RE | Admit: 2017-07-03 | Discharge: 2017-07-05 | DRG: 775 | Disposition: A | Discharge status: Home / Self Care | Payer: Medicaid Other | Source: Ambulatory Visit | Attending: Obstetrics and Gynecology | Admitting: Obstetrics and Gynecology

## 2017-07-03 ENCOUNTER — Encounter (HOSPITAL_COMMUNITY): Payer: Self-pay

## 2017-07-03 DIAGNOSIS — O24425 Gestational diabetes mellitus in childbirth, controlled by oral hypoglycemic drugs: Secondary | ICD-10-CM | POA: Diagnosis present

## 2017-07-03 DIAGNOSIS — O24429 Gestational diabetes mellitus in childbirth, unspecified control: Secondary | ICD-10-CM | POA: Diagnosis not present

## 2017-07-03 DIAGNOSIS — O0993 Supervision of high risk pregnancy, unspecified, third trimester: Secondary | ICD-10-CM

## 2017-07-03 DIAGNOSIS — Z3A39 39 weeks gestation of pregnancy: Secondary | ICD-10-CM | POA: Diagnosis not present

## 2017-07-03 DIAGNOSIS — O99824 Streptococcus B carrier state complicating childbirth: Secondary | ICD-10-CM | POA: Diagnosis present

## 2017-07-03 DIAGNOSIS — E669 Obesity, unspecified: Secondary | ICD-10-CM | POA: Diagnosis present

## 2017-07-03 DIAGNOSIS — Z6841 Body Mass Index (BMI) 40.0 and over, adult: Secondary | ICD-10-CM | POA: Diagnosis not present

## 2017-07-03 DIAGNOSIS — O24419 Gestational diabetes mellitus in pregnancy, unspecified control: Secondary | ICD-10-CM

## 2017-07-03 DIAGNOSIS — O99214 Obesity complicating childbirth: Secondary | ICD-10-CM | POA: Diagnosis present

## 2017-07-03 LAB — COMPREHENSIVE METABOLIC PANEL
ALBUMIN: 2.8 g/dL — AB (ref 3.5–5.0)
ALK PHOS: 105 U/L (ref 38–126)
ALT: 39 U/L (ref 14–54)
ANION GAP: 12 (ref 5–15)
AST: 33 U/L (ref 15–41)
BILIRUBIN TOTAL: 0.5 mg/dL (ref 0.3–1.2)
BUN: 14 mg/dL (ref 6–20)
CALCIUM: 9.2 mg/dL (ref 8.9–10.3)
CO2: 19 mmol/L — ABNORMAL LOW (ref 22–32)
CREATININE: 0.58 mg/dL (ref 0.44–1.00)
Chloride: 105 mmol/L (ref 101–111)
GFR calc non Af Amer: >60 mL/min (ref >60)
GLUCOSE: 126 mg/dL — AB (ref 65–99)
Potassium: 3.7 mmol/L (ref 3.5–5.1)
Sodium: 136 mmol/L (ref 135–145)
TOTAL PROTEIN: 6.8 g/dL (ref 6.5–8.1)

## 2017-07-03 LAB — ABO/RH: ABO/RH(D): B POS

## 2017-07-03 LAB — RPR: RPR Ser Ql: NONREACTIVE

## 2017-07-03 LAB — GLUCOSE, CAPILLARY
GLUCOSE-CAPILLARY: 100 mg/dL — AB (ref 65–99)
Glucose-Capillary: 76 mg/dL (ref 65–99)
Glucose-Capillary: 101 mg/dL — ABNORMAL HIGH (ref 65–99)
Glucose-Capillary: 109 mg/dL — ABNORMAL HIGH (ref 65–99)

## 2017-07-03 LAB — CBC
HEMATOCRIT: 35.9 % — AB (ref 36.0–46.0)
HEMOGLOBIN: 12.2 g/dL (ref 12.0–15.0)
MCH: 27.8 pg (ref 26.0–34.0)
MCHC: 34 g/dL (ref 30.0–36.0)
MCV: 81.8 fL (ref 78.0–100.0)
Platelets: 335 10*3/uL (ref 150–400)
RBC: 4.39 MIL/uL (ref 3.87–5.11)
RDW: 15.1 % (ref 11.5–15.5)
WBC: 8.1 10*3/uL (ref 4.0–10.5)

## 2017-07-03 LAB — TYPE AND SCREEN
ABO/RH(D): B POS
ANTIBODY SCREEN: NEGATIVE

## 2017-07-03 MED ORDER — TERBUTALINE SULFATE 1 MG/ML IJ SOLN
0.2500 mg | Freq: Once | INTRAMUSCULAR | Status: DC | PRN
  Filled 2017-07-03: qty 1

## 2017-07-03 MED ORDER — MISOPROSTOL 25 MCG QUARTER TABLET
25.0000 ug | ORAL_TABLET | ORAL | Status: DC | PRN
  Administered 2017-07-03 (×2): 25 ug via VAGINAL
  Filled 2017-07-03 (×4): qty 1

## 2017-07-03 MED ORDER — METFORMIN HCL 500 MG PO TABS
1000.0000 mg | ORAL_TABLET | Freq: Two times a day (BID) | ORAL | Status: DC
  Administered 2017-07-03 – 2017-07-04 (×2): 1000 mg via ORAL
  Filled 2017-07-03 (×5): qty 2

## 2017-07-03 MED ORDER — DEXTROSE 5 % IV SOLN
5.0000 10*6.[IU] | Freq: Once | INTRAVENOUS | Status: AC
  Administered 2017-07-03: 5 10*6.[IU] via INTRAVENOUS
  Filled 2017-07-03: qty 5

## 2017-07-03 MED ORDER — LACTATED RINGERS IV SOLN: 500.0000 mL | INTRAVENOUS | Status: DC | PRN

## 2017-07-03 MED ORDER — LACTATED RINGERS IV SOLN
INTRAVENOUS | Status: DC
  Administered 2017-07-03 (×2): via INTRAVENOUS

## 2017-07-03 MED ORDER — OXYTOCIN 40 UNITS IN LACTATED RINGERS INFUSION - SIMPLE MED: 2.5000 [IU]/h | INTRAVENOUS | Status: DC

## 2017-07-03 MED ORDER — LIDOCAINE HCL (PF) 1 % IJ SOLN
30.0000 mL | INTRAMUSCULAR | Status: DC | PRN
  Filled 2017-07-03: qty 30

## 2017-07-03 MED ORDER — OXYTOCIN BOLUS FROM INFUSION: 500.0000 mL | Freq: Once | INTRAVENOUS | Status: DC

## 2017-07-03 MED ORDER — SOD CITRATE-CITRIC ACID 500-334 MG/5ML PO SOLN: 30.0000 mL | ORAL | Status: DC | PRN

## 2017-07-03 MED ORDER — OXYCODONE-ACETAMINOPHEN 5-325 MG PO TABS: 1.0000 | ORAL_TABLET | ORAL | Status: DC | PRN

## 2017-07-03 MED ORDER — ONDANSETRON HCL 4 MG/2ML IJ SOLN: 4.0000 mg | Freq: Four times a day (QID) | INTRAMUSCULAR | Status: DC | PRN

## 2017-07-03 MED ORDER — FLEET ENEMA 7-19 GM/118ML RE ENEM: 1.0000 | ENEMA | RECTAL | Status: DC | PRN

## 2017-07-03 MED ORDER — FENTANYL CITRATE (PF) 100 MCG/2ML IJ SOLN
50.0000 ug | INTRAMUSCULAR | Status: DC | PRN
  Administered 2017-07-04: 100 ug via INTRAVENOUS
  Filled 2017-07-03: qty 2

## 2017-07-03 MED ORDER — ACETAMINOPHEN 325 MG PO TABS: 650.0000 mg | ORAL_TABLET | ORAL | Status: DC | PRN

## 2017-07-03 MED ORDER — OXYTOCIN 40 UNITS IN LACTATED RINGERS INFUSION - SIMPLE MED
1.0000 m[IU]/min | INTRAVENOUS | Status: DC
  Administered 2017-07-03: 2 m[IU]/min via INTRAVENOUS
  Filled 2017-07-03: qty 1000

## 2017-07-03 MED ORDER — OXYCODONE-ACETAMINOPHEN 5-325 MG PO TABS: 2.0000 | ORAL_TABLET | ORAL | Status: DC | PRN

## 2017-07-03 MED ORDER — PENICILLIN G POT IN DEXTROSE 60000 UNIT/ML IV SOLN
3.0000 10*6.[IU] | INTRAVENOUS | Status: DC
  Administered 2017-07-03 (×3): 3 10*6.[IU] via INTRAVENOUS
  Filled 2017-07-03 (×10): qty 50

## 2017-07-03 NOTE — Progress Notes (Addendum)
   Vicki Roberts is a 36 y.o. [phone] at [redacted]w[redacted]d  admitted for induction of labor due to Gestational diabetes.  Subjective: Patient coping well without epidural.   Objective: Vitals:   07/03/17 1356 07/03/17 2009 07/03/17 2201 07/03/17 2230  BP: (!) 136/91 135/74 132/78 124/76  Pulse: 84 78 80 78  Resp:  16 18 16   Temp: 98.8 F (37.1 C) 98.7 F (37.1 C)    TempSrc: Oral Oral    Weight:      Height:       No intake/output data recorded.  FHT:  FHR: 130 bpm, variability: moderate,  accelerations:  Present,  decelerations:  Absent UC:   irregular, every  Minutes 1-4  SVE:   Dilation: 6.5 Effacement (%): 70 Station: -1, 0 Exam by:: Londell Moh, RN Pitocin @ 6 mu/min  Labs: Lab Results  Component Value Date   WBC 8.1 07/03/2017   HGB 12.2 07/03/2017   HCT 35.9 (L) 07/03/2017   MCV 81.8 07/03/2017   PLT 335 07/03/2017   SROM at 2300 clear  Assessment / Plan: progressing normally Blood sugar WNL. Labor: Progressing normally Fetal Wellbeing:  Category I Pain Control:  Labor support without medications Anticipated MOD:  NSVD  Marylene Land 07/03/2017, 11:30 PM

## 2017-07-03 NOTE — Progress Notes (Signed)
LABOR PROGRESS NOTE  Vicki Roberts is a 36 y.o. [phone] at [redacted]w[redacted]d  admitted for IOL for GDMA2  Subjective: Doing well. Denies any concerns.  Foley bulb in place.  Would like to eat if possible.  Objective: BP (!) 136/91   Pulse 84   Temp 98.8 F (37.1 C) (Oral)   Resp 18   Ht 5' (1.524 m)   Wt 102.1 kg (225 lb)   LMP 10/03/2016 (Exact Date)   BMI 43.94 kg/m  or  Vitals:   07/03/17 0728 07/03/17 1356  BP: 136/78 (!) 136/91  Pulse: 98 84  Resp: 18   Temp: 99 F (37.2 C) 98.8 F (37.1 C)  TempSrc: Oral Oral  Weight: 102.1 kg (225 lb)   Height: 5' (1.524 m)    SVE: Dilation: 1.5 Effacement (%): Thick Cervical Position: Posterior Station: -3 Presentation: Vertex Exam by:: Dr Nira Retort FHT: baseline rate 120, moderate varibility, +acel, no decel Toco: contractions q7min  Labs: Lab Results  Component Value Date   WBC 8.1 07/03/2017   HGB 12.2 07/03/2017   HCT 35.9 (L) 07/03/2017   MCV 81.8 07/03/2017   PLT 335 07/03/2017    Patient Active Problem List   Diagnosis Date Noted  . Gestational diabetes mellitus, class A2 07/03/2017  . Eczema 06/14/2017  . Helicobacter pylori gastrointestinal tract infection 06/14/2017  . Supervision of high-risk pregnancy 01/04/2017  . Obesity affecting pregnancy in second trimester 01/04/2017  . GDM, class A2 01/04/2017  . Language barrier 01/04/2017  . Advanced maternal age in multigravida 12/31/2016  . BMI 40.0-44.9, adult (HCC) 10/06/2012  . Elevated liver function tests 10/03/2012    Assessment / Plan: 36 y.o. G4P3003 at [redacted]w[redacted]d here for IOL for GDMA2  Since patient is less than 4 cm dilated and her sugars have been <120, will order carb modified, light labor diet.  Labor: Cervical ripening with FB and cytotec. FB and second dose of cytotec placed at 1400. Fetal Wellbeing:  Cat I Pain Control:  Per patient's request.  Anticipated MOD:  SVD GDMA2: CBG at goal, last CBG 101  [name], MD 07/03/2017, 6:24 PM

## 2017-07-03 NOTE — Progress Notes (Signed)
LABOR PROGRESS NOTE  Vicki Roberts is a 36 y.o. [phone] at [redacted]w[redacted]d  admitted for IOL for GDMA2  Subjective: Doing well. Denies any concerns  Objective: BP (!) 136/91   Pulse 84   Temp 98.8 F (37.1 C) (Oral)   Resp 18   Ht 5' (1.524 m)   Wt 225 lb (102.1 kg)   LMP 10/03/2016 (Exact Date)   BMI 43.94 kg/m  or  Vitals:   07/03/17 0728 07/03/17 1356  BP: 136/78 (!) 136/91  Pulse: 98 84  Resp: 18   Temp: 99 F (37.2 C) 98.8 F (37.1 C)  TempSrc: Oral Oral  Weight: 225 lb (102.1 kg)   Height: 5' (1.524 m)    SVE: Dilation: 1.5 Effacement (%): Thick Cervical Position: Posterior Station: -3 Presentation: Vertex Exam by:: Dr Nira Retort FHT: baseline rate 120, moderate varibility, +acel, no decel Toco: irregular  Labs: Lab Results  Component Value Date   WBC 8.1 07/03/2017   HGB 12.2 07/03/2017   HCT 35.9 (L) 07/03/2017   MCV 81.8 07/03/2017   PLT 335 07/03/2017    Patient Active Problem List   Diagnosis Date Noted  . Gestational diabetes mellitus, class A2 07/03/2017  . Eczema 06/14/2017  . Helicobacter pylori gastrointestinal tract infection 06/14/2017  . Supervision of high-risk pregnancy 01/04/2017  . Obesity affecting pregnancy in second trimester 01/04/2017  . GDM, class A2 01/04/2017  . Language barrier 01/04/2017  . Advanced maternal age in multigravida 12/31/2016  . BMI 40.0-44.9, adult (HCC) 10/06/2012  . Elevated liver function tests 10/03/2012    Assessment / Plan: 36 y.o. G4P3003 at [redacted]w[redacted]d here for IOL for GDMA2  Labor: Cervical ripening with FB and cytotec. GB and second dose of cytotec placed at 1400 Fetal Wellbeing:  Cat I Pain Control:  Per patient's request.  Anticipated MOD:  SVD GDMA2: CBG at goal, last 76  Frederik Pear, MD 07/03/2017, 2:33 PM

## 2017-07-03 NOTE — Anesthesia Pain Management Evaluation Note (Signed)
  [facility] Pain Management Visit Note  Patient: Vicki Roberts, 36 y.o., female  "Hello I am a member of the anesthesia team at [facility]. We have an anesthesia team available at all times to provide care throughout the hospital, including epidural management and anesthesia for C-section. I don't know your plan for the delivery whether it a natural birth, water birth, IV sedation, nitrous supplementation, doula or epidural, but we want to meet your pain goals."   1.Was your pain managed to your expectations on prior hospitalizations?   Yes   2.What is your expectation for pain management during this hospitalization?     Labor support without medications  3.How can we help you reach that goal?   Record the patient's initial score and the patient's pain goal.   Pain: 0  Pain Goal: 6 [facility] wants you to be able to say your pain was always managed very well.  Vicki Roberts 07/03/2017

## 2017-07-03 NOTE — H&P (Signed)
LABOR AND DELIVERY ADMISSION HISTORY AND PHYSICAL NOTE  Vicki Roberts is a 36 y.o. female [phone] with IUP at [redacted]w[redacted]d presenting for IOL due to A2GDM. This is high risk pregnancy given obesity, advanced maternal age and A2GDM.   She reports positive fetal movement. She reports irregular contractions. She denies leakage of fluid or vaginal bleeding. She denies headaches, blurry vision, RUQ pain. She reported moderate L chest pain when she stands up and has addressed this issue with PCP. No current chest pain now. She has no known medication allergies. 3 previous NSVD without complications.  Prenatal History/Complications: Clinic   [facility] high risk  Prenatal Labs  Dating  [facility] 8/17. Dated by 12wk u/s Blood type: B/Positive/-- (02/05 0000)   Genetic Screen Neg NT. afp neg (refused first screen, quad, NIPS)  See genetic counselor note. Antibody:Negative (02/05 0000)  Anatomic [place]  nml Rubella: Immune (02/05 0000)  GTT Early:     failed          Third trimester:  failed RPR: Nonreactive (02/05 0000)   Flu vaccine  Declines HBsAg: Negative (02/05 0000)   TDaP vaccine  04/20/17                                            HIV: Non-reactive (02/06 0000)   Baby Food Breastfeed                                       GBS: positive  Contraception Has done [facility] family planning in past; given more choices at 35 weeks Pap: 12/20/16 negative  Circumcision No   Pediatrician  [facility]   Support Person  Husband Jacqlyn Larsen      Past Medical History: Past Medical History:  Diagnosis Date  . BMI 39.0-39.9,adult 10/06/2012  . Diabetes mellitus without complication (HCC)   . Gall stones 2013  . H. pylori infection 2017  . Varicose veins of both lower extremities     Past Surgical History: Past Surgical History:  Procedure Laterality Date  . CHOLECYSTECTOMY  10/01/2012   Procedure: LAPAROSCOPIC CHOLECYSTECTOMY WITH INTRAOPERATIVE CHOLANGIOGRAM;  Surgeon: Emelia Loron, MD;  Location: [facility] OR;   Service: General;  Laterality: N/A;  . ERCP  10/05/2012   Procedure: ENDOSCOPIC RETROGRADE CHOLANGIOPANCREATOGRAPHY (ERCP);  Surgeon: Rachael Fee, MD;  Location: [facility] OR;  Service: Endoscopy;  Laterality: N/A;    Obstetrical History: OB History    Gravida Para Term Preterm AB Living   4 3 3     3    SAB TAB Ectopic Multiple Live Births           3      Social History: Social History   Social History  . Marital status: Married    Spouse name: N/A  . Number of children: N/A  . Years of education: N/A   Social History Main Topics  . Smoking status: Never Smoker  . Smokeless tobacco: Never Used  . Alcohol use No  . Drug use: No  . Sexual activity: Yes    Birth control/ protection: None   Other Topics Concern  . Not on file   Social History Narrative  . No narrative on file    Family History: Family History  Problem Relation Age of Onset  . Hypertension Mother   .  Hypertension Father   . Asthma Sister     Allergies: No Known Allergies  Prescriptions Prior to Admission  Medication Sig Dispense Refill Last Dose  . metFORMIN (GLUCOPHAGE) 500 MG tablet Two tablets by mouth in the morning and 2 in the evening 90 tablet 3 Taking  . Prenatal Vit-Fe Fumarate-FA (PRENATAL MULTIVITAMIN) TABS tablet Take 1 tablet by mouth daily at 12 noon.   Taking    Review of Systems   All systems reviewed and negative except as stated in HPI  Physical Exam Blood pressure 136/78, pulse 98, temperature 99 F (37.2 C), temperature source Oral, resp. rate 18, height 5' (1.524 m), weight 225 lb (102.1 kg), last menstrual period 10/03/2016. General appearance: alert, cooperative, no distress and moderately obese  Resp: Normal WOB Heart: normal rate Extremities: No calf swelling or tenderness Skin: warm and dry Presentation: cephalic Fetal monitoring: 146 BPM, moderate variabiliy, + accels, no decels Uterine activity: irregular ctx   Prenatal labs: ABO, Rh: B/Positive/-- (02/05  0000) Antibody: Negative (02/05 0000) Rubella: Immune RPR: Non Reactive (06/20 1508)  HBsAg: Negative (02/05 0000)  HIV: Non-reactive (02/06 0000)  GBS:  Positive  GTT: A2GDM Genetic screening: Negative Anatomy US: Normal. EFW 46% at [redacted]w[redacted]d. AFI 22%tile.  Prenatal Transfer Tool  Maternal Diabetes: Yes:  Diabetes Type:  Insulin/Medication controlled Genetic Screening: Normal Maternal Ultrasounds/Referrals: Normal: Repeat US 2/2 A2GDM normal.  Fetal Ultrasounds or other Referrals:  Referred to Materal Fetal Medicine : Serial BPP: 8/8, 10/10, 8/8. Maternal Substance Abuse:  No Significant Maternal Medications:  Meds include: Other: Metformin Significant Maternal Lab Results: Lab values include: Group B Strep positive  No results found for this or any previous visit (from the past 24 hour(s)).  Patient Active Problem List   Diagnosis Date Noted  . Eczema 06/14/2017  . Helicobacter pylori gastrointestinal tract infection 06/14/2017  . Supervision of high-risk pregnancy 01/04/2017  . Obesity affecting pregnancy in second trimester 01/04/2017  . GDM, class A2 01/04/2017  . Language barrier 01/04/2017  . Advanced maternal age in multigravida 12/31/2016  . BMI 40.0-44.9, adult (HCC) 10/06/2012  . Elevated liver function tests 10/03/2012    Assessment: Samanntha Roberts is a 36 y.o. G4P3003 at [redacted]w[redacted]d here for IOL 2/2 A2GDM.  #Labor: Start cytotec for cervical ripening. Will place FB  #Pain: Desires natural course, pain control upon request #A2GDM: Metformin per current regimen #FWB: Cat 1 #ID: GBS positive, IV penicillin #MOF: Breast #MOC: Undecided #Circ: No  Dannette Barbara, Medical Student 07/03/2017, 7:19 AM  OB FELLOW MEDICAL STUDENT NOTE ATTESTATION  I confirm that I have verified the information documented in the medical student's note and that I have also personally performed the physical exam and all medical decision making activities.   Frederik Pear, MD OB  Fellow 07/03/2017, 12:11 PM

## 2017-07-03 NOTE — Progress Notes (Signed)
Labor Progress Note  S: Patient seen & examined for progress of labor. Patient comfortable. Reporting stronger ctx Q5. Foley bulb out. S/p cytotec x2.  O: BP 135/74   Pulse 78   Temp 98.7 F (37.1 C) (Oral)   Resp 16   Ht 5' (1.524 m)   Wt 102.1 kg (225 lb)   LMP 10/03/2016 (Exact Date)   BMI 43.94 kg/m   FHT: 125bpm, mod var, +accels, no decels, + scalp stim TOCO: Q18min, patient looks comfortable during contractions  CVE: Dilation: 4.5 Effacement (%): 50 Cervical Position: Posterior Station: -3 Presentation: Vertex Exam by:: Londell Moh, RN  A&P: 36 y.o. [phone] [redacted]w[redacted]d here for IOL due to [facility].  Labor: Will start pitocin 2x2. Induction per protocol.  Pain: Denies epidural. Pain control upon request. FWB: Cat 1 Anticipate MOD: SVD GDMA2: CBG at goal, last 101. Continue metfomin ID: GBS +, receiving penicillin, adequately treated  Dannette Barbara, Medical Student 07/03/2017 9:45 PM   I confirm that I have verified the information documented in the student's note and that I have also personally reperformed the physical exam and all medical decision making activities.   Luna Kitchens CNM

## 2017-07-03 NOTE — Progress Notes (Signed)
Labor Progress Note  S: Patient seen & examined for progress of labor. Patient comfortable and a little hungry.   O: BP 136/78   Pulse 98   Temp 99 F (37.2 C) (Oral)   Resp 18   Ht 5' (1.524 m)   Wt 102.1 kg (225 lb)   LMP 10/03/2016 (Exact Date)   BMI 43.94 kg/m   FHT: 140bpm, mod var, +accels, no decels TOCO: irregular, widely spaced ctx, patient looks comfortable during contractions  CVE: Dilation: 1 Effacement (%): Thick Cervical Position: Posterior Station: -3 Presentation: Vertex Exam by:: Cher Nakai RN  A&P: 36 y.o. [phone] [redacted]w[redacted]d here for IOL d/t A2GDM.  Serum glucose 126 on admission, but CBG 100 at 0917.  Will continue to monitor CBGs and initiate glucose stabilizer if >120.  Will receive metformin at 1700. S/p 25 mcg cytotec.  Will insert foley bulb at next cervical check. Continue labor induction Anticipate SVD.  Lezlie Octave, MD FM Resident PGY-1 07/03/2017 12:21 PM

## 2017-07-04 ENCOUNTER — Encounter (HOSPITAL_COMMUNITY): Payer: Self-pay

## 2017-07-04 DIAGNOSIS — O99824 Streptococcus B carrier state complicating childbirth: Secondary | ICD-10-CM

## 2017-07-04 DIAGNOSIS — O24429 Gestational diabetes mellitus in childbirth, unspecified control: Secondary | ICD-10-CM

## 2017-07-04 DIAGNOSIS — Z3A39 39 weeks gestation of pregnancy: Secondary | ICD-10-CM

## 2017-07-04 LAB — CBC
HCT: 32.2 % — ABNORMAL LOW (ref 36.0–46.0)
Hemoglobin: 11 g/dL — ABNORMAL LOW (ref 12.0–15.0)
MCH: 27.7 pg (ref 26.0–34.0)
MCHC: 34.2 g/dL (ref 30.0–36.0)
MCV: 81.1 fL (ref 78.0–100.0)
PLATELETS: 300 10*3/uL (ref 150–400)
RBC: 3.97 MIL/uL (ref 3.87–5.11)
RDW: 15 % (ref 11.5–15.5)
WBC: 16.4 10*3/uL — ABNORMAL HIGH (ref 4.0–10.5)

## 2017-07-04 LAB — GLUCOSE, RANDOM: GLUCOSE: 99 mg/dL (ref 65–99)

## 2017-07-04 MED ORDER — SENNOSIDES-DOCUSATE SODIUM 8.6-50 MG PO TABS
2.0000 | ORAL_TABLET | ORAL | Status: DC
  Administered 2017-07-04: 2 via ORAL
  Filled 2017-07-04: qty 2

## 2017-07-04 MED ORDER — SIMETHICONE 80 MG PO CHEW: 80.0000 mg | CHEWABLE_TABLET | ORAL | Status: DC | PRN

## 2017-07-04 MED ORDER — DIBUCAINE 1 % RE OINT: 1.0000 "application " | TOPICAL_OINTMENT | RECTAL | Status: DC | PRN

## 2017-07-04 MED ORDER — ONDANSETRON HCL 4 MG/2ML IJ SOLN: 4.0000 mg | INTRAMUSCULAR | Status: DC | PRN

## 2017-07-04 MED ORDER — TETANUS-DIPHTH-ACELL PERTUSSIS 5-2.5-18.5 LF-MCG/0.5 IM SUSP: 0.5000 mL | Freq: Once | INTRAMUSCULAR | Status: DC

## 2017-07-04 MED ORDER — COCONUT OIL OIL: 1.0000 "application " | TOPICAL_OIL | Status: DC | PRN

## 2017-07-04 MED ORDER — PRENATAL MULTIVITAMIN CH
1.0000 | ORAL_TABLET | Freq: Every day | ORAL | Status: DC
  Administered 2017-07-05: 1 via ORAL
  Filled 2017-07-04 (×2): qty 1

## 2017-07-04 MED ORDER — ONDANSETRON HCL 4 MG PO TABS: 4.0000 mg | ORAL_TABLET | ORAL | Status: DC | PRN

## 2017-07-04 MED ORDER — ACETAMINOPHEN 325 MG PO TABS: 650.0000 mg | ORAL_TABLET | ORAL | Status: DC | PRN

## 2017-07-04 MED ORDER — DIPHENHYDRAMINE HCL 25 MG PO CAPS: 25.0000 mg | ORAL_CAPSULE | Freq: Four times a day (QID) | ORAL | Status: DC | PRN

## 2017-07-04 MED ORDER — ZOLPIDEM TARTRATE 5 MG PO TABS: 5.0000 mg | ORAL_TABLET | Freq: Every evening | ORAL | Status: DC | PRN

## 2017-07-04 MED ORDER — WITCH HAZEL-GLYCERIN EX PADS: 1.0000 "application " | MEDICATED_PAD | CUTANEOUS | Status: DC | PRN

## 2017-07-04 MED ORDER — BENZOCAINE-MENTHOL 20-0.5 % EX AERO: 1.0000 "application " | INHALATION_SPRAY | CUTANEOUS | Status: DC | PRN

## 2017-07-04 MED ORDER — IBUPROFEN 600 MG PO TABS
600.0000 mg | ORAL_TABLET | Freq: Four times a day (QID) | ORAL | Status: DC
  Administered 2017-07-04 – 2017-07-05 (×5): 600 mg via ORAL
  Filled 2017-07-04 (×5): qty 1

## 2017-07-04 NOTE — Lactation Note (Signed)
This note was copied from a baby's chart. Lactation Consultation Note  Patient Name: Vicki Roberts VANVB'T Date: 07/04/2017 Reason for consult: Initial assessment  Baby 13 hours old. Assisted by [employer] interpreter "Jearld Adjutant" [phone]. Mom reports that she nursed her first 3 children without any issues. Mom states that this baby is latching and nursing fine. Mom given [facility] brochure, aware of OP/BFSG and LC phone line assistance after D/C.   Maternal Data Has patient been taught Hand Expression?: Yes  Feeding Feeding Type: Breast Fed Length of feed: 20 min  LATCH Score                   Interventions    Lactation Tools Discussed/Used     Consult Status Consult Status: Follow-up Date: 07/05/17 Follow-up type: In-patient    Sherlyn Hay 07/04/2017, 3:25 PM

## 2017-07-05 DIAGNOSIS — O99824 Streptococcus B carrier state complicating childbirth: Secondary | ICD-10-CM

## 2017-07-05 DIAGNOSIS — O24429 Gestational diabetes mellitus in childbirth, unspecified control: Secondary | ICD-10-CM

## 2017-07-05 DIAGNOSIS — Z3A39 39 weeks gestation of pregnancy: Secondary | ICD-10-CM

## 2017-07-05 NOTE — Discharge Summary (Signed)
OB Discharge Summary     Patient Name: Vicki Roberts DOB: [birth year] MRN: [id number]  Date of admission: 07/03/2017 Delivering MD: Marylene Land   Date of discharge: 07/05/2017  Admitting diagnosis: 39wks, induction Intrauterine pregnancy: [redacted]w[redacted]d     Secondary diagnosis:  Active Problems:   Gestational diabetes mellitus, class A2  Additional problems: GDMA2; GBS pos     Discharge diagnosis: Term Pregnancy Delivered                                                                                                Post partum procedures:none  Augmentation: Pitocin, Cytotec and Foley Balloon  Complications: None  Hospital course:  Induction of Labor With Vaginal Delivery   36 y.o. yo [id number] at [redacted]w[redacted]d was admitted to the hospital 07/03/2017 for induction of labor.  Indication for induction: A2 DM.  Patient had an uncomplicated labor course as follows: Membrane Rupture Time/Date: 10:52 PM ,07/03/2017   Intrapartum Procedures: Episiotomy: None [1]                                         Lacerations:  None [1]  Patient had delivery of a Viable infant.  Information for the patient's newborn:  Vicki Roberts [[id number]]  Delivery Method: Vaginal, Spontaneous Delivery (Filed from Delivery Summary)   07/04/2017  Details of delivery can be found in separate delivery note.  Patient had a routine postpartum course. Patient is discharged home 07/05/17.  Physical exam  Vitals:   07/04/17 0345 07/04/17 0445 07/04/17 1640 07/05/17 0500  BP: (!) 114/52 133/80 120/74 124/71  Pulse: 68 68 97 76  Resp: 18 18 18 16   Temp: 99.5 F (37.5 C) 98.9 F (37.2 C) 98.1 F (36.7 C) 98 F (36.7 C)  TempSrc: Oral Oral Oral Oral  SpO2:    98%  Weight:      Height:       General: alert, cooperative and no distress Lochia: appropriate Uterine Fundus: firm Incision: N/A DVT Evaluation: No evidence of DVT seen on physical exam. Labs: Lab Results  Component Value Date   WBC  16.4 (H) 07/04/2017   HGB 11.0 (L) 07/04/2017   HCT 32.2 (L) 07/04/2017   MCV 81.1 07/04/2017   PLT 300 07/04/2017   CMP Latest Ref Rng & Units 07/04/2017  Glucose 65 - 99 mg/dL 99  BUN 6 - 20 mg/dL -  Creatinine 1.70 - 0.17 mg/dL -  Sodium 494 - 496 mmol/L -  Potassium 3.5 - 5.1 mmol/L -  Chloride 101 - 111 mmol/L -  CO2 22 - 32 mmol/L -  Calcium 8.9 - 10.3 mg/dL -  Total Protein 6.5 - 8.1 g/dL -  Total Bilirubin 0.3 - 1.2 mg/dL -  Alkaline Phos 38 - 759 U/L -  AST 15 - 41 U/L -  ALT 14 - 54 U/L -    Discharge instruction: per After Visit Summary and "Baby and Me Booklet".  After visit meds:  Allergies as of 07/05/2017  No Known Allergies     Medication List    TAKE these medications   metFORMIN 500 MG tablet Commonly known as:  GLUCOPHAGE Two tablets by mouth in the morning and 2 in the evening What changed:  how much to take  how to take this  when to take this  additional instructions   prenatal multivitamin Tabs tablet Take 1 tablet by mouth at bedtime.       Diet: routine diet  Activity: Advance as tolerated. Pelvic rest for 6 weeks.   Outpatient follow up:4-6 weeks Follow up Appt: Future Appointments Date Time Provider Department Center  08/16/2017 8:00 AM Armando Reichert, CNM [facility]   Follow up Visit:No Follow-up on file.  Postpartum contraception: Wants to discuss at postpartum office visit  Newborn Data: Live born female  Birth Weight: 6 lb 12.3 oz (3070 g) APGAR: 9, 10  Baby Feeding: Bottle and Breast Disposition:home with mother   07/05/2017 [name], MD   CNM attestation I have seen and examined this patient and agree with above documentation in the resident's note.   Vicki Roberts is a 36 y.o. [id number] s/p SVD.   Pain is well controlled.  Plan for birth control is undecided.  Method of Feeding: both  PE:  BP 124/71 (BP Location: Right Arm)   Pulse 76   Temp 98 F (36.7 C) (Oral)   Resp 16   Ht 5'  (1.524 m)   Wt 102.1 kg (225 lb)   LMP 10/03/2016 (Exact Date)   SpO2 98%   Breastfeeding? Unknown   BMI 43.94 kg/m  Fundus firm   Recent Labs  07/03/17 0739 07/04/17 0636  HGB 12.2 11.0*  HCT 35.9* 32.2*     Plan: discharge today - postpartum care discussed - f/u clinic in 6 weeks for postpartum visit- will need glucola at [facility] visit - inbox msg sent for nursing to call pt and d/c Metformin    [name], CNM 11:17 PM 07/05/2017

## 2017-07-07 ENCOUNTER — Telehealth: Payer: Self-pay | Admitting: *Deleted

## 2017-07-07 NOTE — Telephone Encounter (Addendum)
-----   Message from Arabella Merles, CNM sent at 07/05/2017 11:19 PM EDT ----- Regarding: Discontinue meds As I'm signing off Discharge Summaries from this morning, I see that this pt was mistakenly sent home to continue taking Metformin.  Pls call her and tell her to stop taking it and she doesn't need to check her CBGs. We rec heart healthy diet in general b/c of her higher risk for future DM. She will need a glucola at her 6wk PP visit. Thanks! Kim  8/22  1155  Called pt w/[employer] interpreter Harmon Pier  [phone] and informed her that she should stop checking her blood sugar and stop taking Metformin. She should follow a heart healthy diet. She will need to be NPO after midnight except for water prior to next appt on 10/1 @ 0800. Pt voiced understanding of all information and instructions given.

## 2017-08-16 ENCOUNTER — Encounter: Payer: Self-pay | Admitting: Advanced Practice Midwife

## 2017-08-16 ENCOUNTER — Ambulatory Visit (INDEPENDENT_AMBULATORY_CARE_PROVIDER_SITE_OTHER): Payer: Self-pay | Admitting: Advanced Practice Midwife

## 2017-08-16 DIAGNOSIS — O24419 Gestational diabetes mellitus in pregnancy, unspecified control: Secondary | ICD-10-CM

## 2017-08-16 NOTE — Patient Instructions (Addendum)
Congestin mamaria (Breast Engorgement) La congestin mamaria se produce cuando las mamas se llenan excesivamente con leche materna. Durante las primeras semanas despus de dar a luz, algunas mujeres pueden experimentar congestin mamaria. A pesar de que es normal que sienta las mamas pesadas, llenas e incmodas durante 3 a 5 das despus del parto, la congestin mamaria puede provocar dolor punzante, dureza, tensin excesiva, calor y sensibilidad en las mamas. El pico de la congestin mamaria ocurre aproximadamente al quinto da despus del parto. Esta afeccin puede tratarse fcilmente y no es necesario que deje de amamantar. CAUSAS DE LA CONGESTIN MAMARIA Algunas mujeres retrasan los horarios de la alimentacin porque tienen los pezones doloridos y agrietados, lo que puede causar una congestin. Este problema a menudo se origina porque el beb no se prende adecuadamente (cuando la boca del beb se sujeta a la mama para alimentarse). Si el beb se prende correctamente, debera poder alimentarse el tiempo que sea necesario, sin causar ningn dolor. Si siente dolor al amamantar, retire al beb de la mama e intntelo nuevamente. Solicite ayuda a su mdico o a un asesor en lactancia, si contina con dolor. Otras causas de congestin mamaria incluyen:  Posicin inadecuada del beb al amamantar.  Dejar que pase mucho tiempo entre cada alimentacin.  Reduccin de la lactancia materna porque se le da al beb agua, jugo, leche maternizada, leche materna en un bibern o un chupete, en lugar de amamantarlo.  Cambios en los patrones de alimentacin del beb.  Succin dbil del beb, que hace que se extraiga menos leche de la mama en cada alimentacin.  Fatiga, estrs, anemia.  Obstruccin de los conductos mamarios.  Antecedente de ciruga de mama. SIGNOS Y SNTOMAS DE CONGESTIN MAMARIA Si las mamas se congestionan, puede experimentar:  Hinchazn, sensibilidad, calor, enrojecimiento o dolor punzante  en las mamas.  Dureza en las mamas y tensin de la piel a su alrededor.  Achatamiento, tensin y dureza de los pezones.  Fiebre baja, que puede confundirse con una infeccin mamaria. RECOMENDACIONES PARA ALIVIAR LA CONGESTIN MAMARIA La congestin mamaria debera mejorar en 24 a 48 horas, despus de seguir estas recomendaciones:  Alimente al beb cuando muestre signos de hambre o si siente la necesidad de reducir la congestin de las mamas. Esto se denomina "lactancia a demanda".  Los recin nacidos (bebs de menos de 4semanas) a menudo se alimentan cada 1 a 3 horas durante el da. Es posible que deba despertar al beb para alimentarlo si est dormido en el horario de la alimentacin.  No permita que el beb duerma ms de 5 horas durante la noche sin alimentarse.  Extraiga leche materna manualmente o con un sacaleche antes de amamantar, para ablandar la mama, la areola y el pezn.  Aplique calor o calor hmedo (en la ducha o con toallas de mano empapadas con agua tibia) antes de amamantar o extraer leche, o bien masajee las mamas antes o durante la lactancia. Esto aumenta la circulacin y ayuda a que la leche fluya.  Vace por completo las mamas al amamantar o extraer leche. Luego use un sostn ajustado (para amamantar o comn) o camiseta sin mangas durante 1 o 2 das, para indicar al cuerpo que disminuya ligeramente la produccin de leche. Solo use sostenes ajustados o camisetas sin mangas para tratar la congestin mamaria. Por lo general, las madres que amamantan deben evitar los sostenes ajustados. Una vez que se alivie la congestin mamaria, vuelva a usar la ropa holgada habitual.  Aplique compresas de hielo a   las mamas para aliviar el dolor de la congestin y la hinchazn, a menos que el hielo le resulte molesto.  No retrase los horarios de la alimentacin. Trate de relajarse cuando sea el momento de alimentar al beb. Esto ayuda a estimular el "reflejo de eyeccin de la leche", que  libera leche de la mama.  Asegrese de que el beb se prenda a la mama y se encuentre en la posicin correcta mientras lo alimenta.  Permita que el beb permanezca en el pecho mientras est bien prendido y succione activamente. El beb le har saber cuando haya terminado de alimentarse, al retirarse de la mama o quedarse dormido.  Evite dar biberones o chupetes al beb en las primeras semanas de la lactancia. Espere a que haya resuelto todos los desafos que implica amamantar.  Trate de extraer leche en el mismo horario en que alimentara a su beb, si ha regresado a su trabajo o est fuera de su casa por un perodo prolongado.  Beba mucho lquido para evitar la deshidratacin, que, finalmente, puede implicar un mayor riesgo de sufrir congestin mamaria. COMUNQUESE CON SU MDICO O ASESOR EN LACTANCIA SI:  La congestin mamaria dura ms de 2das, aun despus del tratamiento.  Tiene sntomas similares a los de la gripe, como fiebre, escalofros o dolor en el cuerpo.  Aumenta el enrojecimiento y el dolor en las mamas.  Esta informacin no tiene como fin reemplazar el consejo del mdico. Asegrese de hacerle al mdico cualquier pregunta que tenga. Document Released: 04/20/2008 Document Revised: 11/23/2014 Document Reviewed: 04/27/2013 Elsevier Interactive Patient Education  2017 Elsevier Inc.  

## 2017-08-16 NOTE — Addendum Note (Signed)
Addended by: Mikey Bussing on: 08/16/2017 08:54 AM   Modules accepted: Orders

## 2017-08-16 NOTE — Progress Notes (Signed)
Subjective:     Vicki Roberts is a 36 y.o. female who presents for a postpartum visit. She is 6 weeks postpartum following a spontaneous vaginal delivery. I have fully reviewed the prenatal and intrapartum course. The delivery was at 39 gestational weeks. Outcome: spontaneous vaginal delivery. Anesthesia: none. Postpartum course has been uncomplicated. Baby's course has been uncomplicated. Baby is feeding by both breast and bottle - Similac Advance. Bleeding no bleeding. Bowel function is normal. Bladder function is normal. Patient is not sexually active. Contraception method is none. Postpartum depression screening: negative.  Breastfeeding is going well. She is doing some bottles, but mostly breast. She reports that her right breast has been swollen for about 3 weeks.   Review of Systems A comprehensive review of systems was negative.   Objective:    There were no vitals taken for this visit.  General:  alert, cooperative and appears stated age   Breasts:  inspection negative, no nipple discharge or bleeding, no masses or nodularity palpable  Lungs: NA  Heart:  regular rate and rhythm  Abdomen: soft, non-tender; bowel sounds normal; no masses,  no organomegaly   Vulva:  NA  Vagina: not evaluated  Cervix:  NA  Corpus: not examined  Adnexa:  NA  Rectal Exam: Not performed.        Assessment:    Normal postpartum exam. Pap smear not done at today's visit.   Plan:    1. Contraception: none 2. Routine care 3. Follow up in: 1 Year or as needed.

## 2017-08-17 LAB — GLUCOSE TOLERANCE, 2 HOURS
GLUCOSE FASTING GTT: 90 mg/dL (ref 65–99)
Glucose, 2 hour: 73 mg/dL (ref 65–139)

## 2019-05-03 ENCOUNTER — Encounter (HOSPITAL_COMMUNITY): Payer: Self-pay | Admitting: Emergency Medicine

## 2019-05-03 ENCOUNTER — Emergency Department (HOSPITAL_COMMUNITY): Payer: Self-pay

## 2019-05-03 ENCOUNTER — Emergency Department (HOSPITAL_COMMUNITY)
Admission: EM | Admit: 2019-05-03 | Discharge: 2019-05-04 | Disposition: A | Discharge status: Home / Self Care | Payer: Self-pay | Attending: Emergency Medicine | Admitting: Emergency Medicine

## 2019-05-03 ENCOUNTER — Other Ambulatory Visit: Payer: Self-pay

## 2019-05-03 DIAGNOSIS — Z7984 Long term (current) use of oral hypoglycemic drugs: Secondary | ICD-10-CM | POA: Insufficient documentation

## 2019-05-03 DIAGNOSIS — K760 Fatty (change of) liver, not elsewhere classified: Secondary | ICD-10-CM | POA: Insufficient documentation

## 2019-05-03 DIAGNOSIS — D72829 Elevated white blood cell count, unspecified: Secondary | ICD-10-CM | POA: Insufficient documentation

## 2019-05-03 DIAGNOSIS — E119 Type 2 diabetes mellitus without complications: Secondary | ICD-10-CM | POA: Insufficient documentation

## 2019-05-03 DIAGNOSIS — R1033 Periumbilical pain: Secondary | ICD-10-CM | POA: Insufficient documentation

## 2019-05-03 DIAGNOSIS — Z79899 Other long term (current) drug therapy: Secondary | ICD-10-CM | POA: Insufficient documentation

## 2019-05-03 LAB — COMPREHENSIVE METABOLIC PANEL
ALT: 61 U/L — ABNORMAL HIGH (ref 0–44)
AST: 38 U/L (ref 15–41)
Albumin: 4 g/dL (ref 3.5–5.0)
Alkaline Phosphatase: 78 U/L (ref 38–126)
Anion gap: 11 (ref 5–15)
BUN: 14 mg/dL (ref 6–20)
CO2: 23 mmol/L (ref 22–32)
Calcium: 9.2 mg/dL (ref 8.9–10.3)
Chloride: 101 mmol/L (ref 98–111)
Creatinine, Ser: 0.67 mg/dL (ref 0.44–1.00)
GFR calc Af Amer: >60 mL/min (ref >60)
GFR calc non Af Amer: >60 mL/min (ref >60)
Glucose, Bld: 115 mg/dL — ABNORMAL HIGH (ref 70–99)
Potassium: 3.8 mmol/L (ref 3.5–5.1)
Sodium: 135 mmol/L (ref 135–145)
Total Bilirubin: 0.9 mg/dL (ref 0.3–1.2)
Total Protein: 8 g/dL (ref 6.5–8.1)

## 2019-05-03 LAB — CBC
HCT: 38.4 % (ref 36.0–46.0)
Hemoglobin: 12.7 g/dL (ref 12.0–15.0)
MCH: 28.3 pg (ref 26.0–34.0)
MCHC: 33.1 g/dL (ref 30.0–36.0)
MCV: 85.5 fL (ref 80.0–100.0)
Platelets: 386 10*3/uL (ref 150–400)
RBC: 4.49 MIL/uL (ref 3.87–5.11)
RDW: 13.2 % (ref 11.5–15.5)
WBC: 18.1 10*3/uL — ABNORMAL HIGH (ref 4.0–10.5)
nRBC: 0 % (ref 0.0–0.2)

## 2019-05-03 LAB — URINALYSIS, ROUTINE W REFLEX MICROSCOPIC
Bilirubin Urine: NEGATIVE
Glucose, UA: NEGATIVE mg/dL
Ketones, ur: NEGATIVE mg/dL
Leukocytes,Ua: NEGATIVE
Nitrite: NEGATIVE
Protein, ur: NEGATIVE mg/dL
Specific Gravity, Urine: 1.026 (ref 1.005–1.030)
pH: 5 (ref 5.0–8.0)

## 2019-05-03 LAB — WET PREP, GENITAL
Clue Cells Wet Prep HPF POC: NONE SEEN
Sperm: NONE SEEN
Trich, Wet Prep: NONE SEEN
Yeast Wet Prep HPF POC: NONE SEEN

## 2019-05-03 LAB — LIPASE, BLOOD: Lipase: 37 U/L (ref 11–51)

## 2019-05-03 LAB — I-STAT BETA HCG BLOOD, ED (MC, WL, AP ONLY): I-stat hCG, quantitative: <5 m[IU]/mL (ref <5)

## 2019-05-03 MED ORDER — IOHEXOL 300 MG/ML  SOLN
100.0000 mL | Freq: Once | INTRAMUSCULAR | Status: AC | PRN
  Administered 2019-05-03: 100 mL via INTRAVENOUS

## 2019-05-03 MED ORDER — MORPHINE SULFATE (PF) 4 MG/ML IV SOLN
4.0000 mg | Freq: Once | INTRAVENOUS | Status: AC
  Administered 2019-05-03: 22:00:00 4 mg via INTRAVENOUS
  Filled 2019-05-03: qty 1

## 2019-05-03 MED ORDER — ALUM & MAG HYDROXIDE-SIMETH 200-200-20 MG/5ML PO SUSP
15.0000 mL | Freq: Once | ORAL | Status: AC
  Administered 2019-05-04: 01:00:00 15 mL via ORAL
  Filled 2019-05-03: qty 30

## 2019-05-03 MED ORDER — SODIUM CHLORIDE 0.9 % IV BOLUS
1000.0000 mL | Freq: Once | INTRAVENOUS | Status: AC
  Administered 2019-05-03: 22:00:00 1000 mL via INTRAVENOUS

## 2019-05-03 MED ORDER — SODIUM CHLORIDE 0.9% FLUSH: 3.0000 mL | Freq: Once | INTRAVENOUS | Status: DC

## 2019-05-03 NOTE — ED Triage Notes (Signed)
Pt reports 10 minutes after eating enchiladas for lunch she had abdominal pain around her belly button with nausea. Pt reports feeling swollen. Pain is 8/10.

## 2019-05-03 NOTE — ED Notes (Signed)
Patient transported to CT 

## 2019-05-04 LAB — GC/CHLAMYDIA PROBE AMP (~~LOC~~) NOT AT ARMC
Chlamydia: NEGATIVE
Neisseria Gonorrhea: NEGATIVE

## 2019-05-04 MED ORDER — OMEPRAZOLE 20 MG PO CPDR: 20.0000 mg | DELAYED_RELEASE_CAPSULE | Freq: Every day | ORAL | 0 refills | Status: DC

## 2019-05-04 MED ORDER — SUCRALFATE 1 G PO TABS: 1.0000 g | ORAL_TABLET | Freq: Three times a day (TID) | ORAL | 0 refills | Status: DC

## 2019-05-04 NOTE — ED Provider Notes (Signed)
[facility]   CSN: [id number] Arrival date & time: 05/03/19  1902     History   Chief Complaint Chief Complaint  Patient presents with   Abdominal Pain    HPI Vicki Roberts is a 38 y.o. female.     HPI   Patient is a 37-year female with past medical history of gestational diabetes, H. pylori infection, gallstones, obesity presenting for abdominal pain.  Patient reports that the pain began while she was eating lunch today around the periumbilical region.  She feels that is a bit worse on her right side of the abdomen currently.  Patient reports she has had nausea but no vomiting.  She has not had any diarrhea and has had normal bowel movements without melena or hematochezia.  She denies any increased vaginal discharge or abnormal vaginal bleeding.  She denies chance of pregnancy.  Patient denies any concerns about STI.  She reports she is sexually active with one female partner.  Patient has a past surgical history of cholecystitis.  Patient reports she has a frontal headache that is improving.  Past Medical History:  Diagnosis Date   BMI 39.0-39.9,adult 10/06/2012   Diabetes mellitus without complication (HCC)    Gall stones 2013   H. pylori infection 2017   Varicose veins of both lower extremities     Patient Active Problem List   Diagnosis Date Noted   Gestational diabetes mellitus, class A2 07/03/2017   Eczema 06/14/2017   Helicobacter pylori gastrointestinal tract infection 06/14/2017   Supervision of high-risk pregnancy 01/04/2017   Obesity affecting pregnancy in second trimester 01/04/2017   GDM, class A2 01/04/2017   Language barrier 01/04/2017   Advanced maternal age in multigravida 12/31/2016   BMI 40.0-44.9, adult (HCC) 10/06/2012   Elevated liver function tests 10/03/2012    Past Surgical History:  Procedure Laterality Date   CHOLECYSTECTOMY  10/01/2012   Procedure: LAPAROSCOPIC  CHOLECYSTECTOMY WITH INTRAOPERATIVE CHOLANGIOGRAM;  Surgeon: Emelia LoronMatthew Wakefield, MD;  [facility];  Service: General;  Laterality: N/A;   ERCP  10/05/2012   Procedure: ENDOSCOPIC RETROGRADE CHOLANGIOPANCREATOGRAPHY (ERCP);  Surgeon: Rachael Feeaniel P Jacobs, MD;  [facility];  Service: Endoscopy;  Laterality: N/A;     OB History    Gravida  4   Para  4   Term  4   Preterm      AB      Living  4     SAB      TAB      Ectopic      Multiple  0   Live Births  4            Home Medications    Prior to Admission medications   Medication Sig Start Date End Date Taking? Authorizing Provider  metFORMIN (GLUCOPHAGE) 500 MG tablet Two tablets by mouth in the morning and 2 in the evening Patient not taking: Reported on 08/16/2017 06/21/17   Adam PhenixArnold, James G, MD  Prenatal Vit-Fe Fumarate-FA (PRENATAL MULTIVITAMIN) TABS tablet Take 1 tablet by mouth at bedtime.     [provider]    Family History Family History  Problem Relation Age of Onset   Hypertension Mother    Hypertension Father    Asthma Sister     Social History Social History   Tobacco Use   Smoking status: Never Smoker   Smokeless tobacco: Never Used  Substance Use Topics   Alcohol use: No   Drug use: No  Allergies   Patient has no known allergies.   Review of Systems Review of Systems  Constitutional: Negative for chills and fever.  HENT: Negative for congestion and sore throat.   Eyes: Negative for visual disturbance.  Respiratory: Negative for chest tightness and shortness of breath.   Cardiovascular: Negative for chest pain.  Gastrointestinal: Positive for abdominal pain. Negative for diarrhea, nausea and vomiting.  Genitourinary: Negative for dysuria, flank pain, vaginal bleeding and vaginal discharge.  Musculoskeletal: Negative for back pain and myalgias.  Skin: Negative for rash.  Neurological: Positive for headaches. Negative for dizziness and syncope.      Physical Exam Updated Vital Signs BP (!) 98/53 (BP Location: Left Arm)    Pulse 87    Temp 98.9 F (37.2 C) (Oral)    Resp 18    Ht 5' (1.524 m)    Wt 95.3 kg    LMP 04/20/2019 (Approximate)    SpO2 97%    BMI 41.01 kg/m   Physical Exam Vitals signs and nursing note reviewed.  Constitutional:      General: She is not in acute distress.    Appearance: She is well-developed.  HENT:     Head: Normocephalic and atraumatic.  Eyes:     Conjunctiva/sclera: Conjunctivae normal.     Pupils: Pupils are equal, round, and reactive to light.  Neck:     Musculoskeletal: Normal range of motion and neck supple.  Cardiovascular:     Rate and Rhythm: Normal rate and regular rhythm.     Heart sounds: S1 normal and S2 normal. No murmur.  Pulmonary:     Effort: Pulmonary effort is normal.     Breath sounds: Normal breath sounds. No wheezing or rales.  Abdominal:     General: There is no distension.     Palpations: Abdomen is soft.     Tenderness: There is no abdominal tenderness. There is no guarding.  Genitourinary:    Comments: Pelvic examination performed with nurse tech chaperone present. Patient has no external lesions of the vagina or perineum. Vaginal tissue pink and rugated. Cervix nonerythematous and nonfriable. Mild amount of thick white discharge present in vaginal vault.  On bimanual exam, patient has no cervical motion tenderness nor adnexal tenderness bilaterally. Musculoskeletal: Normal range of motion.        General: No deformity.  Lymphadenopathy:     Cervical: No cervical adenopathy.  Skin:    General: Skin is warm and dry.     Findings: No erythema or rash.  Neurological:     Mental Status: She is alert.     Comments: Cranial nerves grossly intact. Patient moves extremities symmetrically and with good coordination.  Psychiatric:        Behavior: Behavior normal.        Thought Content: Thought content normal.        Judgment: Judgment normal.      ED  Treatments / Results  Labs (all labs ordered are listed, but only abnormal results are displayed) Labs Reviewed  WET PREP, GENITAL - Abnormal; Notable for the following components:      Result Value   WBC, Wet Prep HPF POC MANY (*)    All other components within normal limits  COMPREHENSIVE METABOLIC PANEL - Abnormal; Notable for the following components:   Glucose, Bld 115 (*)    ALT 61 (*)    All other components within normal limits  CBC - Abnormal; Notable for the following components:   WBC 18.1 (*)  All other components within normal limits  URINALYSIS, ROUTINE W REFLEX MICROSCOPIC - Abnormal; Notable for the following components:   Hgb urine dipstick SMALL (*)    Bacteria, UA RARE (*)    All other components within normal limits  LIPASE, BLOOD  RPR  HIV ANTIBODY (ROUTINE TESTING W REFLEX)  I-STAT BETA HCG BLOOD, ED ([facility], [facility], [facility] ONLY)  GC/CHLAMYDIA PROBE AMP ([place]) NOT AT [facility]    EKG    Radiology Ct Abdomen Pelvis W Contrast  Result Date: 05/03/2019 CLINICAL DATA:  38 y/o  F; abdominal pain. EXAM: CT ABDOMEN AND PELVIS WITH CONTRAST TECHNIQUE: Multidetector CT imaging of the abdomen and pelvis was performed using the standard protocol following bolus administration of intravenous contrast. CONTRAST:  100mL OMNIPAQUE IOHEXOL 300 MG/ML  SOLN COMPARISON:  10/03/2012 MRI of the abdomen. FINDINGS: Lower chest: No acute abnormality. Hepatobiliary: Hepatic steatosis. No focal liver abnormality is seen. Status post cholecystectomy. Common bile duct measures up to 11 mm, likely compensatory post cholecystectomy. No intrahepatic biliary dilatation. Pancreas: Unremarkable. No pancreatic ductal dilatation or surrounding inflammatory changes. Spleen: Normal in size without focal abnormality. Adrenals/Urinary Tract: Adrenal glands are unremarkable. Kidneys are normal, without renal calculi, focal lesion, or hydronephrosis. Bladder is unremarkable. Stomach/Bowel: Small hiatal hernia.  Stomach is within normal limits. Appendix appears normal. No evidence of bowel wall thickening, distention, or inflammatory changes. Vascular/Lymphatic: No significant vascular findings are present. No enlarged abdominal or pelvic lymph nodes. Reproductive: Uterus and bilateral adnexa are unremarkable. Other: No abdominal wall hernia or abnormality. No abdominopelvic ascites. Musculoskeletal: No acute or significant osseous findings. IMPRESSION: 1. No acute process identified as explanation for abdominal pain. 2. Hepatic steatosis. Small hiatal hernia.  Cholecystectomy. Electronically Signed   By: Mitzi HansenLance  Furusawa-Stratton M.D.   On: 05/03/2019 23:00    Procedures Procedures (including critical care time)  Medications Ordered in ED Medications  sodium chloride flush (NS) 0.9 % injection 3 mL (has no administration in time range)  alum & mag hydroxide-simeth (MAALOX/MYLANTA) 200-200-20 MG/5ML suspension 15 mL (has no administration in time range)  sodium chloride 0.9 % bolus 1,000 mL (1,000 mLs Intravenous New Bag/Given 05/03/19 2215)  morphine 4 MG/ML injection 4 mg (4 mg Intravenous Given 05/03/19 2215)  iohexol (OMNIPAQUE) 300 MG/ML solution 100 mL (100 mLs Intravenous Contrast Given 05/03/19 2243)     Initial Impression / Assessment and Plan / ED Course  I have reviewed the triage vital signs and the nursing notes.  Pertinent labs & imaging results that were available during my care of the patient were reviewed by me and considered in my medical decision making (see chart for details).  Clinical Course as of May 04 11  Wed May 03, 2019  2121 WBC(!): 18.1 [AM]  2148 Patient verbally verified a safe ride from the ED. Proceeded with prescribing Morphine for pain/relaxtion/muscle relaxation in the ED.   [AM]  2331 Pt reports pain improved. Will perform pelvic exam.    [AM]    Clinical Course User Index [AM] Elisha PonderMurray, Deidrick Rainey B, PA-C       Patient is nontoxic-appearing, hemodynamically  stable, in no acute distress.  Patient initial tachycardic reading which resolved in the emergency department.  She remains afebrile here.  Differential diagnosis includes appendicitis, gastritis, pelvic inflammatory disease, urinary tract infection, ruptured ovarian cyst.   Work-up demonstrating leukocytosis of 18.  Patient does have a history of leukocytosis during labor.  CMP remarkable only for ALT elevation of 61.  Urinalysis without evidence of infection.  Wet prep demonstrating many WBCs, however this does not correlate with PID on exam.  GC chlamydia is pending.   CT abdomen and pelvis performed due to concern for appendicitis which is negative.  Gallbladder surgically absent.  No acute findings to explain leukocytosis.  Repeat abdominal exams remained benign.  We will reinitiate patient on proton pump inhibitor therapy.  She is remote history of gastritis with H. pylori infection.  Patient is instructed to follow-up with primary care provider.  Patient given her precautions for any worsening pain, tractable nausea or vomiting, or focal pain.  She is also instructed that a negative CT scan does not necessitate the absence of appendicitis and if she is worsening she should return.  [employer] Spanish interpreter used for discharge instructions.  Final Clinical Impressions(s) / ED Diagnoses   Final diagnoses:  Periumbilical pain  Leukocytosis, unspecified type    ED Discharge Orders         Ordered    omeprazole (PRILOSEC) 20 MG capsule  Daily     05/04/19 0028    sucralfate (CARAFATE) 1 g tablet  3 times daily with meals & bedtime     05/04/19 0028           Aviva KluverMurray, Richard Ritchey B, PA-C 05/04/19 0033    Melene PlanFloyd, Dan, DO 05/04/19 2305

## 2019-05-04 NOTE — Discharge Instructions (Signed)
Tome omeprazol una vez al da por la Robertson.  Comience a tomar un medicamento llamado Carafate o sucralfato. Esto se puede tomar [employer] 4 veces al da, con las comidas y antes de [place]. No tome su inhibidor de la bomba de protones (Protonix, Nexium, Prilosec) dentro de los 30 minutos de tomar Carafate.  Creo que su dolor puede ser causado por la irritacin del revestimiento del [place].  Regrese al departamento de emergencias por cualquier empeoramiento del dolor, nuseas o vmitos que le impida retener cualquier cosa o heces oscuras.    Haga un seguimiento con su mdico de atencin primaria lo antes posible.  I think you pain may be caused by irritation of the lining of the stomach.   Please take omeprazole once daily in the morning.  Please start taking a medicine called Carafate or sucralfate.  This can be taken up to 4 times a day, with meals and before bedtime.  Please do not take your proton pump inhibitor (Protonix, Nexium, Prilosec) within 30 minutes of taking Carafate.  Please follow up with your primary care doctor as soon as possible.   Please come back to the emergency department for any worsening pain, nausea or vomiting that prevents you from keeping anything down, or dark stools.

## 2019-05-04 NOTE — ED Notes (Signed)
E-signature not available, verbalized understanding of DC instructions and prescriptions.  

## 2020-12-18 DIAGNOSIS — Z8616 Personal history of COVID-19: Secondary | ICD-10-CM | POA: Insufficient documentation

## 2021-05-14 IMAGING — CT CT ABDOMEN AND PELVIS WITH CONTRAST
2 of 4 series · 16 of 46 positions shown, 18 images · IV contrast (Omni 300)
Comparison: 10/03/2012 MRI of the abdomen.

CLINICAL DATA: 37 y/o  F; abdominal pain.

EXAM:
CT ABDOMEN AND PELVIS WITH CONTRAST
TECHNIQUE: Multidetector CT imaging of the abdomen and pelvis was performed
using the standard protocol following bolus administration of
intravenous contrast.
CONTRAST:  100mL OMNIPAQUE IOHEXOL 300 MG/ML  SOLN

[Series 3: a/p w/ 5mm · axial · 0.98mm/px · z∈[-629,-154]mm · 13 of 105 slices shown, 15 images]
[im 5/105  soft-tissue]
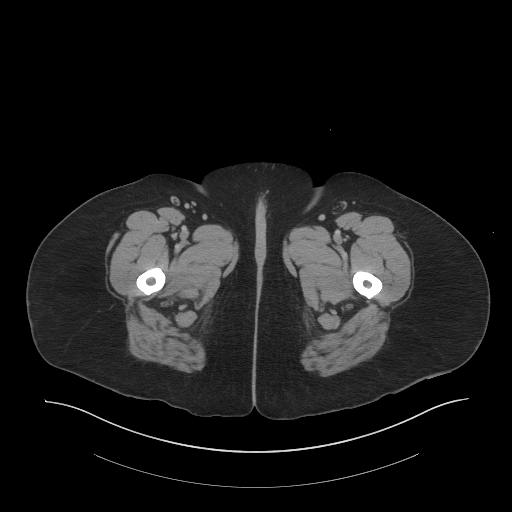
[im 5/105  bone]
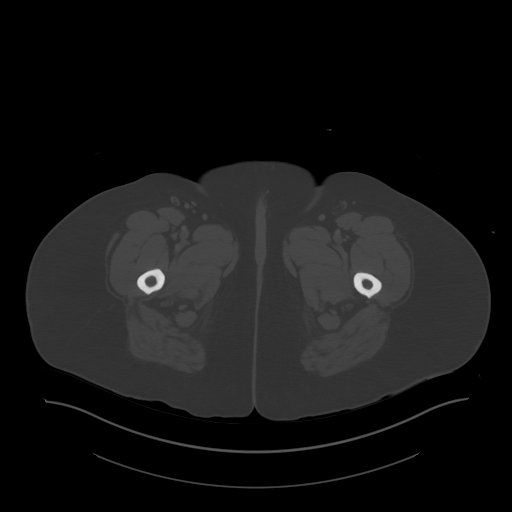
[im 14/105  soft-tissue]
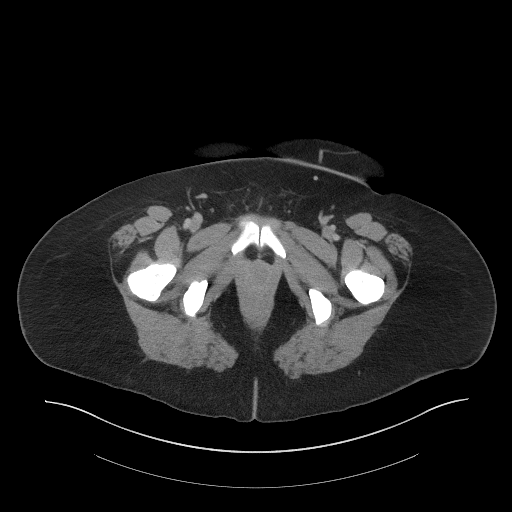
[im 22/105  soft-tissue]
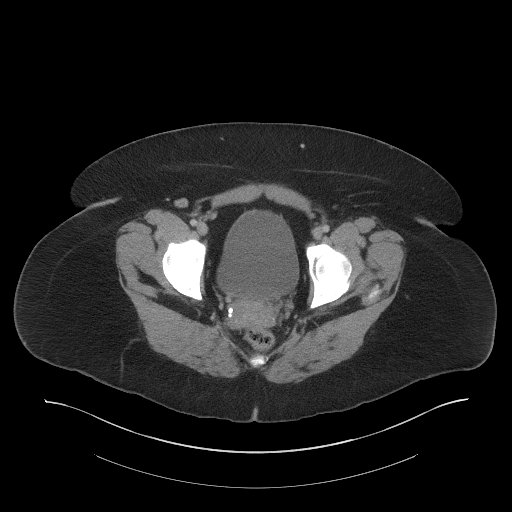
[im 31/105  soft-tissue]
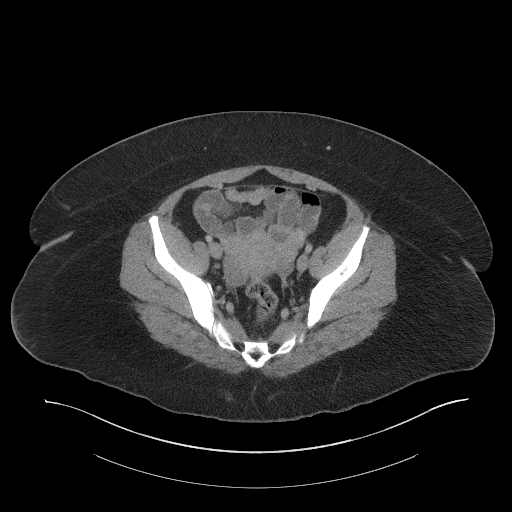
[im 35/105  soft-tissue]
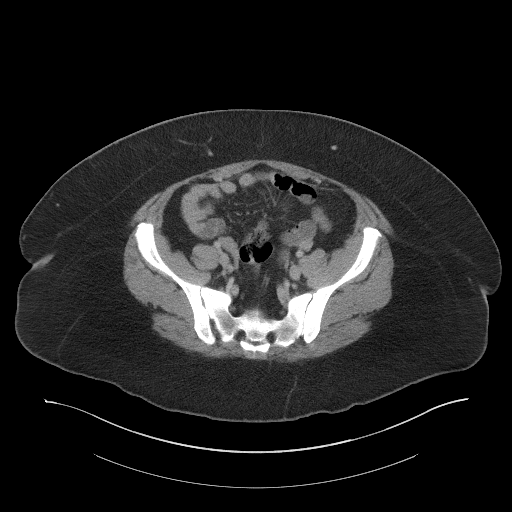
[im 44/105  soft-tissue]
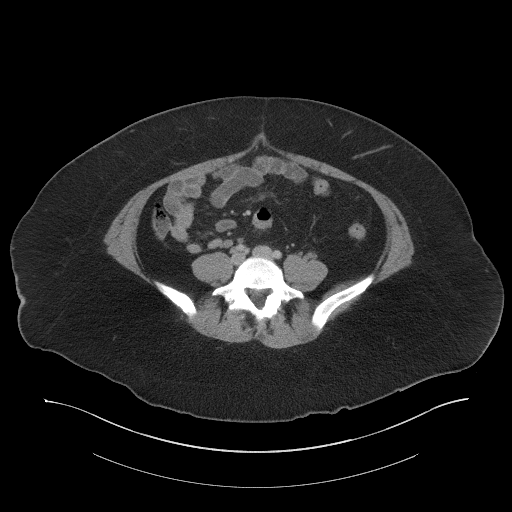
[im 53/105  soft-tissue]
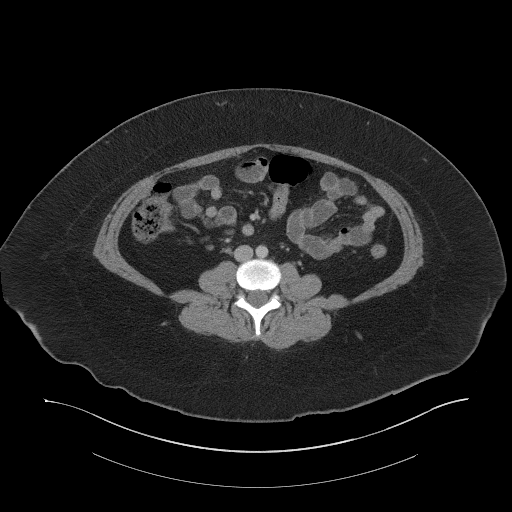
[im 61/105  soft-tissue]
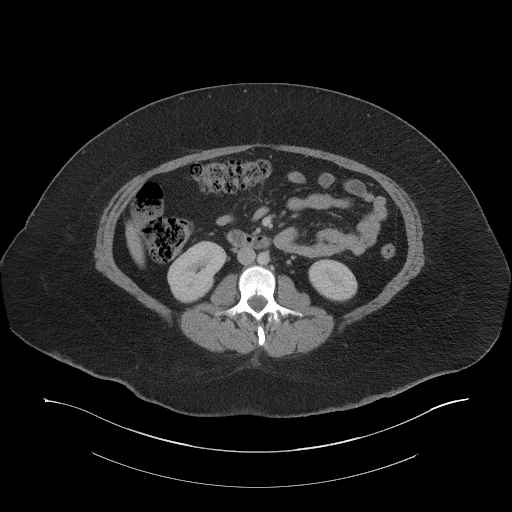
[im 70/105  soft-tissue]
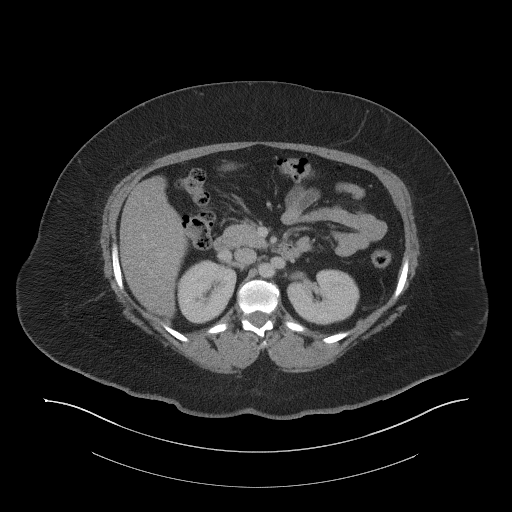
[im 70/105  bone]
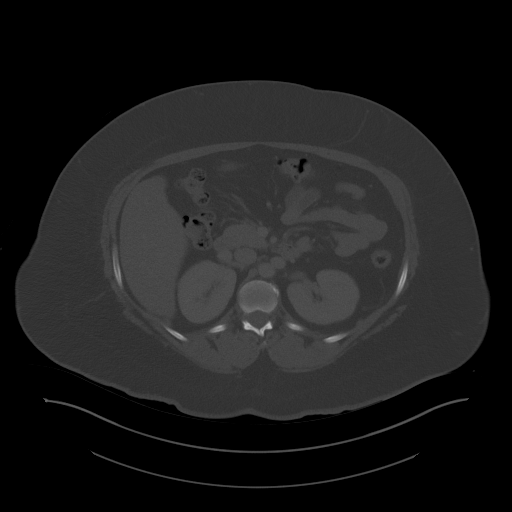
[im 74/105  soft-tissue]
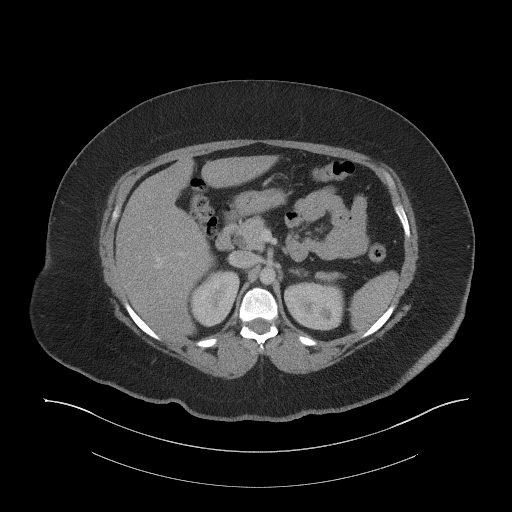
[im 83/105  soft-tissue]
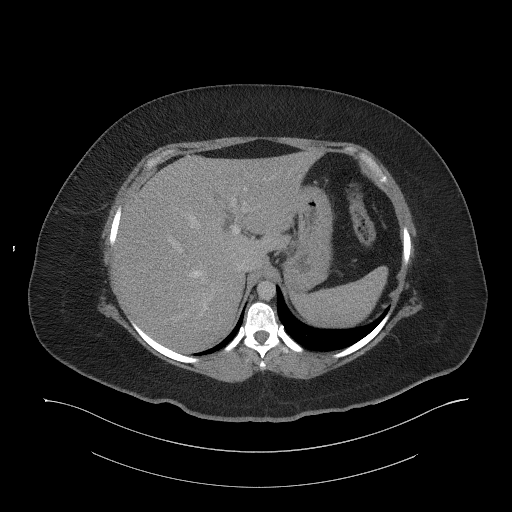
[im 92/105  soft-tissue]
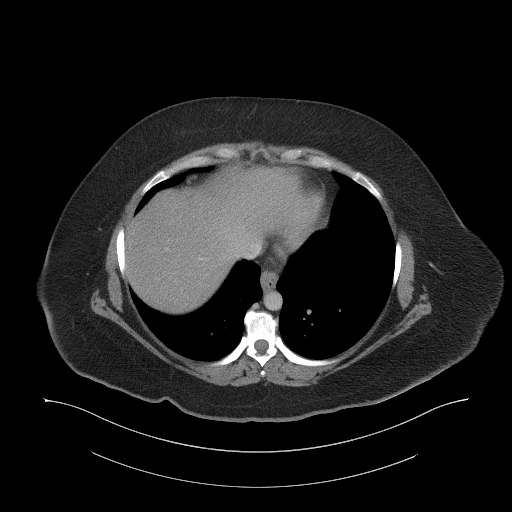
[im 100/105  soft-tissue]
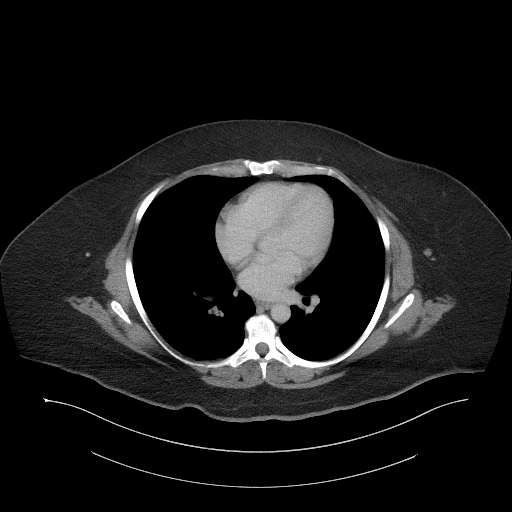

[Series 6: a/p w/ cor · coronal · 1.02mm/px · 3 of 182 slices shown]
[im 61/182  soft-tissue]
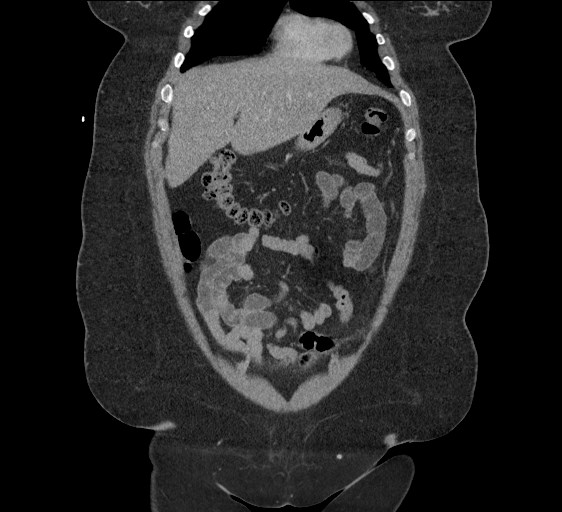
[im 81/182  soft-tissue]
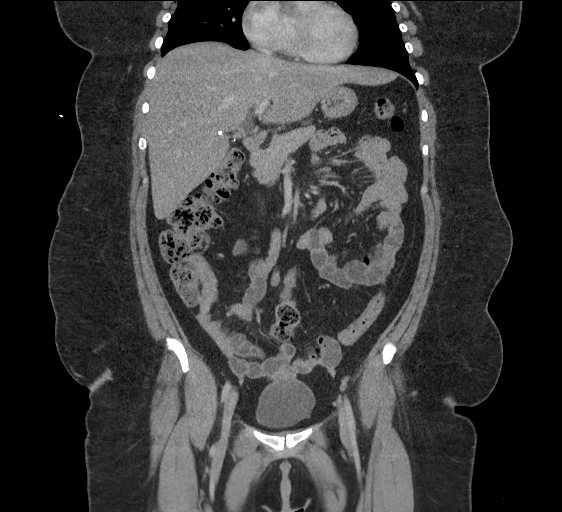
[im 101/182  soft-tissue]
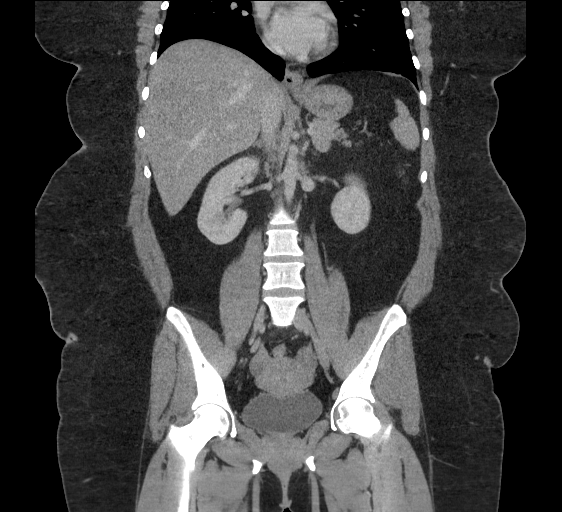

[16 of 46 positions shown; findings below may reference images not displayed]

FINDINGS: Lower chest: No acute abnormality.

Hepatobiliary: Hepatic steatosis. No focal liver abnormality is
seen. Status post cholecystectomy. Common bile duct measures up to
11 mm, likely compensatory post cholecystectomy. No intrahepatic
biliary dilatation.

Pancreas: Unremarkable. No pancreatic ductal dilatation or
surrounding inflammatory changes.

Spleen: Normal in size without focal abnormality.

Adrenals/Urinary Tract: Adrenal glands are unremarkable. Kidneys are
normal, without renal calculi, focal lesion, or hydronephrosis.
Bladder is unremarkable.

Stomach/Bowel: Small hiatal hernia. Stomach is within normal limits.
Appendix appears normal. No evidence of bowel wall thickening,
distention, or inflammatory changes.

Vascular/Lymphatic: No significant vascular findings are present. No
enlarged abdominal or pelvic lymph nodes.

Reproductive: Uterus and bilateral adnexa are unremarkable.

Other: No abdominal wall hernia or abnormality. No abdominopelvic
ascites.

Musculoskeletal: No acute or significant osseous findings.
IMPRESSION: 1. No acute process identified as explanation for abdominal pain.
2. Hepatic steatosis. Small hiatal hernia.  Cholecystectomy.

## 2022-10-14 ENCOUNTER — Inpatient Hospital Stay (HOSPITAL_COMMUNITY)
Admission: AD | Admit: 2022-10-14 | Discharge: 2022-10-14 | Disposition: A | Discharge status: Home / Self Care | Payer: Self-pay | Attending: Obstetrics and Gynecology | Admitting: Obstetrics and Gynecology

## 2022-10-14 ENCOUNTER — Encounter (HOSPITAL_COMMUNITY): Payer: Self-pay | Admitting: *Deleted

## 2022-10-14 ENCOUNTER — Inpatient Hospital Stay (HOSPITAL_COMMUNITY): Payer: Self-pay

## 2022-10-14 DIAGNOSIS — O09521 Supervision of elderly multigravida, first trimester: Secondary | ICD-10-CM | POA: Insufficient documentation

## 2022-10-14 DIAGNOSIS — O468X1 Other antepartum hemorrhage, first trimester: Secondary | ICD-10-CM

## 2022-10-14 DIAGNOSIS — O208 Other hemorrhage in early pregnancy: Secondary | ICD-10-CM | POA: Insufficient documentation

## 2022-10-14 DIAGNOSIS — R102 Pelvic and perineal pain: Secondary | ICD-10-CM | POA: Insufficient documentation

## 2022-10-14 DIAGNOSIS — Z79899 Other long term (current) drug therapy: Secondary | ICD-10-CM | POA: Insufficient documentation

## 2022-10-14 DIAGNOSIS — Z3A10 10 weeks gestation of pregnancy: Secondary | ICD-10-CM | POA: Insufficient documentation

## 2022-10-14 DIAGNOSIS — R109 Unspecified abdominal pain: Secondary | ICD-10-CM | POA: Insufficient documentation

## 2022-10-14 DIAGNOSIS — O26891 Other specified pregnancy related conditions, first trimester: Secondary | ICD-10-CM | POA: Insufficient documentation

## 2022-10-14 DIAGNOSIS — Z3490 Encounter for supervision of normal pregnancy, unspecified, unspecified trimester: Secondary | ICD-10-CM

## 2022-10-14 DIAGNOSIS — O418X10 Other specified disorders of amniotic fluid and membranes, first trimester, not applicable or unspecified: Secondary | ICD-10-CM

## 2022-10-14 DIAGNOSIS — Z6790 Unspecified blood type, Rh positive: Secondary | ICD-10-CM

## 2022-10-14 DIAGNOSIS — M549 Dorsalgia, unspecified: Secondary | ICD-10-CM | POA: Insufficient documentation

## 2022-10-14 LAB — COMPREHENSIVE METABOLIC PANEL
ALT: 42 U/L (ref 0–44)
AST: 22 U/L (ref 15–41)
Albumin: 3.6 g/dL (ref 3.5–5.0)
Alkaline Phosphatase: 70 U/L (ref 38–126)
Anion gap: 11 (ref 5–15)
BUN: 10 mg/dL (ref 6–20)
CO2: 23 mmol/L (ref 22–32)
Calcium: 8.9 mg/dL (ref 8.9–10.3)
Chloride: 100 mmol/L (ref 98–111)
Creatinine, Ser: 0.68 mg/dL (ref 0.44–1.00)
GFR, Estimated: >60 mL/min (ref >60)
Glucose, Bld: 113 mg/dL — ABNORMAL HIGH (ref 70–99)
Potassium: 3.8 mmol/L (ref 3.5–5.1)
Sodium: 134 mmol/L — ABNORMAL LOW (ref 135–145)
Total Bilirubin: 0.7 mg/dL (ref 0.3–1.2)
Total Protein: 7.6 g/dL (ref 6.5–8.1)

## 2022-10-14 LAB — HCG, QUANTITATIVE, PREGNANCY: hCG, Beta Chain, Quant, S: 3978 m[IU]/mL — ABNORMAL HIGH (ref <5)

## 2022-10-14 LAB — POCT PREGNANCY, URINE: Preg Test, Ur: POSITIVE — AB

## 2022-10-14 LAB — CBC
HCT: 37 % (ref 36.0–46.0)
Hemoglobin: 12.2 g/dL (ref 12.0–15.0)
MCH: 26.9 pg (ref 26.0–34.0)
MCHC: 33 g/dL (ref 30.0–36.0)
MCV: 81.5 fL (ref 80.0–100.0)
Platelets: 372 10*3/uL (ref 150–400)
RBC: 4.54 MIL/uL (ref 3.87–5.11)
RDW: 16.2 % — ABNORMAL HIGH (ref 11.5–15.5)
WBC: 8.5 10*3/uL (ref 4.0–10.5)
nRBC: 0 % (ref 0.0–0.2)

## 2022-10-14 LAB — WET PREP, GENITAL
Clue Cells Wet Prep HPF POC: NONE SEEN
Sperm: NONE SEEN
Trich, Wet Prep: NONE SEEN
WBC, Wet Prep HPF POC: <10 (ref <10)
Yeast Wet Prep HPF POC: NONE SEEN

## 2022-10-14 LAB — HIV ANTIBODY (ROUTINE TESTING W REFLEX): HIV Screen 4th Generation wRfx: NONREACTIVE

## 2022-10-14 NOTE — MAU Note (Addendum)
.  Vicki Roberts is a 41 y.o. at Unknown here in MAU reporting spotting since last Thursday. Initially spotting was brown but this am was red.  Reports some lower back pain and pelvic pain that is sharp and constant LMP: 08/05/22 Onset of complaint: Thursday Pain score: 6-7 Vitals:   10/14/22 0609 10/14/22 0611  BP:  128/68  Pulse: 86   Resp: 17   Temp: 98.5 F (36.9 C)   SpO2: 100%      FHT:n/a Lab orders placed from triage:  upt and u/a

## 2022-10-14 NOTE — MAU Provider Note (Addendum)
Chief Complaint: Vaginal Bleeding   Event Date/Time   First Provider Initiated Contact with Patient 10/14/22 [phone]        SUBJECTIVE HPI: Vicki Roberts is a 41 y.o. G5P4004 at [redacted]w[redacted]d  by LMP who presents to maternity admissions reporting spotting since last week.  Has some low back pain and pelvic pain.  Has not been seen for this pregnancy. . She denies vaginal itching/burning, urinary symptoms, h/a, dizziness, n/v, or fever/chills.    Vaginal Bleeding The patient's primary symptoms include pelvic pain and vaginal bleeding. The patient's pertinent negatives include no genital itching. This is a new problem. The current episode started 1 to 4 weeks ago. The pain is moderate. She is pregnant. Associated symptoms include abdominal pain and back pain. Pertinent negatives include no diarrhea, dysuria, fever, frequency or nausea. The vaginal discharge was bloody. The vaginal bleeding is spotting. She has not been passing clots. She has not been passing tissue. Nothing aggravates the symptoms. She has tried nothing for the symptoms.   RN Note: Vicki Roberts is a 41 y.o. at Unknown here in MAU reporting spotting since last Thursday. Initially spotting was brown but this am was red.  Reports some lower back pain and pelvic pain that is sharp and constant LMP: 08/05/22 Onset of complaint: Thursday Pain score: 6-7  Past Medical History:  Diagnosis Date   BMI 39.0-39.9,adult 10/06/2012   Diabetes mellitus without complication ([place])    Gall stones 2013   H. pylori infection 2017   Varicose veins of both lower extremities    Past Surgical History:  Procedure Laterality Date   CHOLECYSTECTOMY  10/01/2012   Procedure: LAPAROSCOPIC CHOLECYSTECTOMY WITH INTRAOPERATIVE CHOLANGIOGRAM;  Surgeon: Rolm Bookbinder, MD;  Location: [facility];  Service: General;  Laterality: N/A;   ERCP  10/05/2012   Procedure: ENDOSCOPIC RETROGRADE CHOLANGIOPANCREATOGRAPHY (ERCP);  Surgeon: Milus Banister, MD;   Location: [facility];  Service: Endoscopy;  Laterality: N/A;   Social History   Socioeconomic History   Marital status: Married    Spouse name: Not on file   Number of children: Not on file   Years of education: Not on file   Highest education level: Not on file  Occupational History   Not on file  Tobacco Use   Smoking status: Never   Smokeless tobacco: Never  Substance and Sexual Activity   Alcohol use: No   Drug use: No   Sexual activity: Yes    Birth control/protection: None  Other Topics Concern   Not on file  Social History Narrative   Not on file   Social Determinants of Health   Financial Resource Strain: Not on file  Food Insecurity: Not on file  Transportation Needs: Not on file  Physical Activity: Not on file  Stress: Not on file  Social Connections: Not on file  Intimate Partner Violence: Not on file   No current facility-administered medications on file prior to encounter.   Current Outpatient Medications on File Prior to Encounter  Medication Sig Dispense Refill   levothyroxine (SYNTHROID) 100 MCG tablet Take 100 mcg by mouth daily before breakfast.     metFORMIN (GLUCOPHAGE) 500 MG tablet Take 500 mg by mouth daily with breakfast.     Prenatal Vit-Fe Fumarate-FA (PRENATAL MULTIVITAMIN) TABS tablet Take 1 tablet by mouth at bedtime.      metFORMIN (GLUCOPHAGE) 500 MG tablet Two tablets by mouth in the morning and 2 in the evening (Patient not taking: Reported on 08/16/2017) 90 tablet 3  omeprazole (PRILOSEC) 20 MG capsule Take 1 capsule (20 mg total) by mouth daily. (Patient not taking: Reported on 10/14/2022) 30 capsule 0   sucralfate (CARAFATE) 1 g tablet Take 1 tablet (1 g total) by mouth 4 (four) times daily -  with meals and at bedtime for 10 days. (Patient not taking: Reported on 10/14/2022) 40 tablet 0   No Known Allergies  I have reviewed patient's Past Medical Hx, Surgical Hx, Family Hx, Social Hx, medications and allergies.   ROS:  Review of  Systems  Constitutional:  Negative for fever.  Gastrointestinal:  Positive for abdominal pain. Negative for diarrhea and nausea.  Genitourinary:  Positive for pelvic pain and vaginal bleeding. Negative for dysuria and frequency.  Musculoskeletal:  Positive for back pain.   Review of Systems  Other systems negative   Physical Exam  Physical Exam Patient Vitals for the past 24 hrs:  BP Temp Pulse Resp SpO2 Height Weight  10/14/22 0611 128/68 -- -- -- -- -- --  10/14/22 0609 -- 98.5 F (36.9 C) 86 17 100 % 5' (1.524 m) 102.5 kg   Constitutional: Well-developed, well-nourished female in no acute distress.  Cardiovascular: normal rate Respiratory: normal effort GI: Abd soft, non-tender.  MS: Extremities nontender, no edema, normal ROM Neurologic: Alert and oriented x 4.  GU: Neg CVAT.  PELVIC EXAM: deferred in lieu of transvaginal ultrasound  LAB RESULTS      IMAGING   MAU Management/MDM: I have reviewed the triage vital signs and the nursing notes.   Pertinent labs & imaging results that were available during my care of the patient were reviewed by me and considered in my medical decision making (see chart for details).      I have reviewed her medical records including past results, notes and treatments.   Ordered usual first trimester r/o ectopic labs.   Pelvic cultures done Will check baseline Ultrasound to rule out ectopic.  This bleeding/pain can represent a normal pregnancy with bleeding, spontaneous abortion or even an ectopic which can be life-threatening.  The process as listed above helps to determine which of these is present.  Care turned over to [facility] Pregnancy at [redacted]w[redacted]d by LMP Bleeding in early pregnancy Pregnancy of unknown location   Hansel Feinstein CNM, MSN Certified Nurse-Midwife 10/14/2022  6:37 AM  --Care assumed of patient at 0800  Results for orders placed or performed during the hospital encounter of 10/14/22 (from  the past 24 hour(s))  Pregnancy, urine POC     Status: Abnormal   Collection Time: 10/14/22  6:26 AM  Result Value Ref Range   Preg Test, Ur POSITIVE (A) NEGATIVE  Wet prep, genital     Status: None   Collection Time: 10/14/22  6:30 AM  Result Value Ref Range   Yeast Wet Prep HPF POC NONE SEEN NONE SEEN   Trich, Wet Prep NONE SEEN NONE SEEN   Clue Cells Wet Prep HPF POC NONE SEEN NONE SEEN   WBC, Wet Prep HPF POC <10 <10   Sperm NONE SEEN   CBC     Status: Abnormal   Collection Time: 10/14/22  6:42 AM  Result Value Ref Range   WBC 8.5 4.0 - 10.5 K/uL   RBC 4.54 3.87 - 5.11 MIL/uL   Hemoglobin 12.2 12.0 - 15.0 g/dL   HCT 37.0 36.0 - 46.0 %   MCV 81.5 80.0 - 100.0 fL   MCH 26.9 26.0 - 34.0 pg   MCHC 33.0  30.0 - 36.0 g/dL   RDW 95.6 (H) 21.3 - 08.6 %   Platelets 372 150 - 400 K/uL   nRBC 0.0 0.0 - 0.2 %  hCG, quantitative, pregnancy     Status: Abnormal   Collection Time: 10/14/22  6:42 AM  Result Value Ref Range   hCG, Beta Chain, Quant, S 3,978 (H) <5 mIU/mL  Comprehensive metabolic panel     Status: Abnormal   Collection Time: 10/14/22  6:42 AM  Result Value Ref Range   Sodium 134 (L) 135 - 145 mmol/L   Potassium 3.8 3.5 - 5.1 mmol/L   Chloride 100 98 - 111 mmol/L   CO2 23 22 - 32 mmol/L   Glucose, Bld 113 (H) 70 - 99 mg/dL   BUN 10 6 - 20 mg/dL   Creatinine, Ser 5.78 0.44 - 1.00 mg/dL   Calcium 8.9 8.9 - 46.9 mg/dL   Total Protein 7.6 6.5 - 8.1 g/dL   Albumin 3.6 3.5 - 5.0 g/dL   AST 22 15 - 41 U/L   ALT 42 0 - 44 U/L   Alkaline Phosphatase 70 38 - 126 U/L   Total Bilirubin 0.7 0.3 - 1.2 mg/dL   GFR, Estimated >62 >95 mL/min   Anion gap 11 5 - 15   US OB LESS THAN 14 WEEKS WITH OB TRANSVAGINAL  Result Date: 10/14/2022 CLINICAL DATA:  Vaginal bleeding in 1st trimester pregnancy. EXAM: TWIN OBSTETRIC <14WK [place] AND TRANSVAGINAL OB US TECHNIQUE: Both transabdominal and transvaginal ultrasound examinations were performed for complete evaluation of the gestation as well  as the maternal uterus, adnexal regions, and pelvic cul-de-sac. Transvaginal technique was performed to assess early pregnancy. COMPARISON:  None Available. FINDINGS: Number of IUPs:  2 Chorionicity/Amnionicity:  Monochorionic-diamniotic (thin membrane) TWIN 1 Yolk sac:  Visualized. Embryo:  Not Visualized. MSD: 13 mm   6 w   1 d TWIN 2 Yolk sac:  Not Visualized. Embryo:  Not Visualized. MSD: 8  mm   5 w   3  d Subchorionic hemorrhage:  Small subchorionic hemorrhage. Maternal uterus/adnexae: Normal appearance of left ovary. Right ovary is not directly visualized, however no adnexal mass or abnormal free fluid identified. IMPRESSION: Suspected early mono-di twin IUP with estimated gestational age of [redacted] weeks 1 day by mean sac diameter. Consider correlation with serial b-hCG levels, and followup ultrasound to assess viability in 10-14 days. Small subchorionic hemorrhage. Electronically Signed   By: Danae Orleans M.D.   On: 10/14/2022 08:11   US OB Comp AddL Gest Less 14 Wks  Result Date: 10/14/2022 CLINICAL DATA:  Vaginal bleeding in 1st trimester pregnancy. EXAM: TWIN OBSTETRIC <14WK [place] AND TRANSVAGINAL OB US TECHNIQUE: Both transabdominal and transvaginal ultrasound examinations were performed for complete evaluation of the gestation as well as the maternal uterus, adnexal regions, and pelvic cul-de-sac. Transvaginal technique was performed to assess early pregnancy. COMPARISON:  None Available. FINDINGS: Number of IUPs:  2 Chorionicity/Amnionicity:  Monochorionic-diamniotic (thin membrane) TWIN 1 Yolk sac:  Visualized. Embryo:  Not Visualized. MSD: 13 mm   6 w   1 d TWIN 2 Yolk sac:  Not Visualized. Embryo:  Not Visualized. MSD: 8  mm   5 w   3  d Subchorionic hemorrhage:  Small subchorionic hemorrhage. Maternal uterus/adnexae: Normal appearance of left ovary. Right ovary is not directly visualized, however no adnexal mass or abnormal free fluid identified. IMPRESSION: Suspected early mono-di twin IUP with  estimated gestational age of [redacted] weeks 1 day by mean sac  diameter. Consider correlation with serial b-hCG levels, and followup ultrasound to assess viability in 10-14 days. Small subchorionic hemorrhage. Electronically Signed   By: Danae Orleans M.D.   On: 10/14/2022 08:11    ASSESSMENT & PLANs --41 y.o. [id number] with early mono-di IUP --Subchorionic hematoma, pelvic rest advised --Rh POS --Language barrier: in-person interpreter Bonnye Fava present for discussion of results and follow-up --Discharge home in stable condition with bleeding precautions  F/U: --Order placed for repeat viability ultrasound at [facility] in 10-14 days  Clayton Bibles, MSA, MSN, CNM Certified Nurse Midwife, Biochemist, clinical for [employer], [facility]

## 2022-10-14 NOTE — Discharge Instructions (Signed)

## 2022-10-15 LAB — GC/CHLAMYDIA PROBE AMP (~~LOC~~) NOT AT ARMC
Chlamydia: NEGATIVE
Comment: NEGATIVE
Comment: NORMAL
Neisseria Gonorrhea: NEGATIVE

## 2022-10-16 DIAGNOSIS — I8390 Asymptomatic varicose veins of unspecified lower extremity: Secondary | ICD-10-CM | POA: Insufficient documentation

## 2022-11-18 ENCOUNTER — Inpatient Hospital Stay (HOSPITAL_COMMUNITY): Payer: Self-pay

## 2022-11-18 ENCOUNTER — Inpatient Hospital Stay (HOSPITAL_COMMUNITY)
Admission: AD | Admit: 2022-11-18 | Discharge: 2022-11-19 | Disposition: A | Discharge status: Home / Self Care | Payer: Self-pay | Attending: Obstetrics and Gynecology | Admitting: Obstetrics and Gynecology

## 2022-11-18 DIAGNOSIS — Z3A15 15 weeks gestation of pregnancy: Secondary | ICD-10-CM

## 2022-11-18 DIAGNOSIS — O039 Complete or unspecified spontaneous abortion without complication: Secondary | ICD-10-CM | POA: Insufficient documentation

## 2022-11-18 DIAGNOSIS — O30001 Twin pregnancy, unspecified number of placenta and unspecified number of amniotic sacs, first trimester: Secondary | ICD-10-CM

## 2022-11-18 DIAGNOSIS — O209 Hemorrhage in early pregnancy, unspecified: Secondary | ICD-10-CM

## 2022-11-18 LAB — URINALYSIS, ROUTINE W REFLEX MICROSCOPIC
Bilirubin Urine: NEGATIVE
Glucose, UA: NEGATIVE mg/dL
Ketones, ur: NEGATIVE mg/dL
Leukocytes,Ua: NEGATIVE
Nitrite: NEGATIVE
Protein, ur: NEGATIVE mg/dL
Specific Gravity, Urine: 1.006 (ref 1.005–1.030)
pH: 6 (ref 5.0–8.0)

## 2022-11-18 NOTE — MAU Note (Signed)
.  Vicki Roberts is a 42 y.o. at [redacted]w[redacted]d here in MAU reporting: VB, pt states since 11/29 she was diagnosis with a hematoma, VB continue for 3 days and stopped then. Pt reports she was told to have a follow up US but was not called. Pt states her pinkish light VB with discharge started again today around 1700, and now more dark red with wiping no clotting. Pt reports lower ABD pain. Pt wearing a pad. Pt denies LOF or abnormal discharge. Last intercourse Monday No PNC yet   Onset of complaint: 1700  Pain score: 4/10 Vitals:   11/18/22 2132  BP: 137/77  Pulse: 90  Resp: 18  Temp: 98.5 F (36.9 C)  SpO2: 99%     FHT:RN attempted, unable to assess  Lab orders placed from triage:  UA

## 2022-11-19 ENCOUNTER — Encounter (HOSPITAL_COMMUNITY): Payer: Self-pay | Admitting: Obstetrics and Gynecology

## 2022-11-19 DIAGNOSIS — Z3A15 15 weeks gestation of pregnancy: Secondary | ICD-10-CM

## 2022-11-19 DIAGNOSIS — O209 Hemorrhage in early pregnancy, unspecified: Secondary | ICD-10-CM

## 2022-11-19 DIAGNOSIS — O039 Complete or unspecified spontaneous abortion without complication: Secondary | ICD-10-CM

## 2022-11-19 LAB — HCG, QUANTITATIVE, PREGNANCY: hCG, Beta Chain, Quant, S: <1 m[IU]/mL (ref <5)

## 2022-11-19 NOTE — MAU Provider Note (Signed)
Chief Complaint: Vaginal Bleeding   Event Date/Time   First Provider Initiated Contact with Patient 11/19/22 0044        SUBJECTIVE HPI: Vicki Roberts is a 42 y.o. P5T6144 at [redacted]w[redacted]d by LMP who presents to maternity admissions reporting pink and red light bleeding with lower abdominal cramping  Was seen on 11/29 for bleeding and noted to have twin gestational sacs and was set to have a followup US , but states was never called (no schedule found in epic).   . She denies vaginal itching/burning, urinary symptoms, h/a, dizziness, n/v, or fever/chills.    Vaginal Bleeding The patient's primary symptoms include pelvic pain and vaginal bleeding. The patient's pertinent negatives include no genital itching or genital odor. This is a new problem. The current episode started yesterday. The problem has been unchanged. Associated symptoms include abdominal pain and back pain. Pertinent negatives include no constipation, diarrhea, fever, headaches, nausea or vomiting. Nothing aggravates the symptoms. She has tried nothing for the symptoms.   RN Note: Vicki Roberts is a 42 y.o. at [redacted]w[redacted]d here in MAU reporting: VB, pt states since 11/29 she was diagnosis with a hematoma, VB continue for 3 days and stopped then. Pt reports she was told to have a follow up US but was not called. Pt states her pinkish light VB with discharge started again today around 1700, and now more dark red with wiping no clotting. Pt reports lower ABD pain. Pt wearing a pad. Pt denies LOF or abnormal discharge. Last intercourse Monday No PNC yet    Past Medical History:  Diagnosis Date   BMI 39.0-39.9,adult 10/06/2012   Diabetes mellitus without complication ([place])    Gall stones 2013   H. pylori infection 2017   Varicose veins of both lower extremities    Past Surgical History:  Procedure Laterality Date   CHOLECYSTECTOMY  10/01/2012   Procedure: LAPAROSCOPIC CHOLECYSTECTOMY WITH INTRAOPERATIVE CHOLANGIOGRAM;  Surgeon:  Rolm Bookbinder, MD;  Location: [facility] OR;  Service: General;  Laterality: N/A;   ERCP  10/05/2012   Procedure: ENDOSCOPIC RETROGRADE CHOLANGIOPANCREATOGRAPHY (ERCP);  Surgeon: Milus Banister, MD;  Location: [place];  Service: Endoscopy;  Laterality: N/A;   Social History   Socioeconomic History   Marital status: Married    Spouse name: Not on file   Number of children: Not on file   Years of education: Not on file   Highest education level: Not on file  Occupational History   Not on file  Tobacco Use   Smoking status: Never   Smokeless tobacco: Never  Substance and Sexual Activity   Alcohol use: No   Drug use: No   Sexual activity: Yes    Birth control/protection: None  Other Topics Concern   Not on file  Social History Narrative   Not on file   Social Determinants of Health   Financial Resource Strain: Not on file  Food Insecurity: Not on file  Transportation Needs: Not on file  Physical Activity: Not on file  Stress: Not on file  Social Connections: Not on file  Intimate Partner Violence: Not on file   No current facility-administered medications on file prior to encounter.   Current Outpatient Medications on File Prior to Encounter  Medication Sig Dispense Refill   levothyroxine (SYNTHROID) 100 MCG tablet Take 100 mcg by mouth daily before breakfast.     Prenatal Vit-Fe Fumarate-FA (PRENATAL MULTIVITAMIN) TABS tablet Take 1 tablet by mouth at bedtime.      No Known  Allergies  I have reviewed patient's Past Medical Hx, Surgical Hx, Family Hx, Social Hx, medications and allergies.   ROS:  Review of Systems  Constitutional:  Negative for fever.  Gastrointestinal:  Positive for abdominal pain. Negative for constipation, diarrhea, nausea and vomiting.  Genitourinary:  Positive for pelvic pain and vaginal bleeding.  Musculoskeletal:  Positive for back pain.  Neurological:  Negative for headaches.   Review of Systems  Other systems negative   Physical Exam   Physical Exam Patient Vitals for the past 24 hrs:  BP Temp Temp src Pulse Resp SpO2 Height Weight  11/18/22 2132 137/77 98.5 F (36.9 C) Oral 90 18 99 % 5' (1.524 m) 103.7 kg   Constitutional: Well-developed, well-nourished female in no acute distress.  Cardiovascular: normal rate Respiratory: normal effort GI: Abd soft, non-tender. Pos BS x 4 MS: Extremities nontender, no edema, normal ROM Neurologic: Alert and oriented x 4.  GU: Neg CVAT.  PELVIC EXAM: deferred in lieu of transvaginal ultrasound  LAB RESULTS Results for orders placed or performed during the hospital encounter of 11/18/22 (from the past 24 hour(s))  Urinalysis, Routine w reflex microscopic     Status: Abnormal   Collection Time: 11/18/22  9:58 PM  Result Value Ref Range   Color, Urine STRAW (A) YELLOW   APPearance CLEAR CLEAR   Specific Gravity, Urine 1.006 1.005 - 1.030   pH 6.0 5.0 - 8.0   Glucose, UA NEGATIVE NEGATIVE mg/dL   Hgb urine dipstick LARGE (A) NEGATIVE   Bilirubin Urine NEGATIVE NEGATIVE   Ketones, ur NEGATIVE NEGATIVE mg/dL   Protein, ur NEGATIVE NEGATIVE mg/dL   Nitrite NEGATIVE NEGATIVE   Leukocytes,Ua NEGATIVE NEGATIVE   RBC / HPF 21-50 0 - 5 RBC/hpf   WBC, UA 0-5 0 - 5 WBC/hpf   Bacteria, UA RARE (A) NONE SEEN   Squamous Epithelial / HPF 0-5 0 - 5 /HPF   Mucus PRESENT   hCG, quantitative, pregnancy     Status: None   Collection Time: 11/19/22  1:06 AM  Result Value Ref Range   hCG, Beta Chain, Quant, S <1 <5 mIU/mL       IMAGING US OB LESS THAN 14 WEEKS WITH OB TRANSVAGINAL  Result Date: 11/18/2022 CLINICAL DATA:  [id number] Antepartum bleeding, second trimester [id number] EXAM: OBSTETRIC <14 WK [place] AND TRANSVAGINAL OB US TECHNIQUE: Both transabdominal and transvaginal ultrasound examinations were performed for complete evaluation of the gestation as well as the maternal uterus, adnexal regions, and pelvic cul-de-sac. Transvaginal technique was performed to assess early pregnancy.  COMPARISON:  Ultrasound Ob 10/14/2022, CT abdomen pelvis 05/03/2019 FINDINGS: Intrauterine gestational sac: None Yolk sac:  Not Visualized. Embryo:  Not Visualized. Cardiac Activity: Not Visualized. Maternal uterus/adnexae: Previously identified intrauterine pregnancy no longer visualized consistent with spontaneous miscarriage. Redemonstration of a more conspicuous 3.9 x 3.1 x 3.5 cm fundal mass suggestive of a submucosal fibroid. The endometrium appears homogeneous measuring up to 8 mm with associated slight vascularity. Bilateral ovaries are unremarkable. Bilateral adnexa are unremarkable. IMPRESSION: 1. Interval spontaneous miscarriage. 2. Likely submucosal 4 cm fundal uterine fibroid. 3. Endometrium appears homogeneous measuring up to 8 mm with associated slight vascularity which may be due to the proximity of the uterine fibroid versus less likely represent retained products of conception. Recommend correlation with quantitative beta HCG. Electronically Signed   By: Tish Frederickson M.D.   On: 11/18/2022 23:21    MAU Management/MDM: I have reviewed the triage vital signs and the nursing notes.  Pertinent labs & imaging results that were available during my care of the patient were reviewed by me and considered in my medical decision making (see chart for details).      I have reviewed her medical records including past results, notes and treatments.   Ordered Ultrasound to rule out spontaneous abortion.  This did show that she has had an interval SAB with no evidence of gestational sacs seen. Marland KitchenHCG is 0.  This bleeding/pain can represent a normal pregnancy with bleeding, spontaneous abortion.  The process as listed above helps to determine which of these is present.  Discussed findings with patient who is tearful Discussed we don't know why SAB happened.  WIll schedule close followup in office.   ASSESSMENT Pregnancy at [redacted]w[redacted]d by LMP Completed spontaneous abortion  PLAN Discharge home Message  sent to office to arrange followup Pt stable at time of discharge. Encouraged to return here if she develops worsening of symptoms, increase in pain, fever, or other concerning symptoms.    Hansel Feinstein CNM, MSN Certified Nurse-Midwife 11/19/2022  12:44 AM

## 2022-12-15 ENCOUNTER — Ambulatory Visit (INDEPENDENT_AMBULATORY_CARE_PROVIDER_SITE_OTHER): Payer: Self-pay | Admitting: Obstetrics and Gynecology

## 2022-12-15 ENCOUNTER — Encounter: Payer: Self-pay | Admitting: Obstetrics and Gynecology

## 2022-12-15 ENCOUNTER — Other Ambulatory Visit: Payer: Self-pay

## 2022-12-15 VITALS — BP 128/80 | HR 97

## 2022-12-15 DIAGNOSIS — Z3009 Encounter for other general counseling and advice on contraception: Secondary | ICD-10-CM

## 2022-12-15 DIAGNOSIS — O039 Complete or unspecified spontaneous abortion without complication: Secondary | ICD-10-CM

## 2022-12-15 DIAGNOSIS — Z789 Other specified health status: Secondary | ICD-10-CM

## 2022-12-15 DIAGNOSIS — Z3A10 10 weeks gestation of pregnancy: Secondary | ICD-10-CM

## 2022-12-15 NOTE — Progress Notes (Signed)
NEW GYNECOLOGY PATIENT Patient name: Vicki Roberts MRN [id number]  Date of birth: February 26, 1981 Chief Complaint:   Follow-up     History:  Vicki Roberts is a 43 y.o. [id number] being seen today for SAB followup. Seen MAU 11/29 (~10wk) and 1/3 (15wks by dating) but had completed SAB.    Would like to discuss birth control options. Has completed childbearing. Has used Mirena, depo provera, patch, and pills. Like the patch but determined ineligible due to weight. Interested in sterilization, preferentially vasectomy but husband is hesitant due to concerns around the procedure.   No contraindications to [facility] other than BMI.      Gynecologic History Patient's last menstrual period was 11/18/2022. Contraception: none Last Pap: 12/2016, NILM Last Mammogram: none Last Colonoscopy: n/a  Obstetric History OB History  Gravida Para Term Preterm AB Living  5 4 4   1 4   SAB IAB Ectopic Multiple Live Births  1     0 4    # Outcome Date GA Lbr Len/2nd Weight Sex Delivery Anes PTL Lv  5 SAB 11/18/22 [redacted]w[redacted]d            Complications: Twin gestation in first trimester  4 Term 07/04/17 [redacted]w[redacted]d 06:29 / 00:01 6 lb 12.3 oz (3.07 kg) M Vag-Spont None  LIV  3 Term 10/30/04 [redacted]w[redacted]d  6 lb (2.722 kg) F Vag-Spont None N LIV  2 Term 02/22/03 [redacted]w[redacted]d  6 lb 11 oz (3.033 kg) F Vag-Spont EPI N LIV  1 Term 05/03/01 [redacted]w[redacted]d  6 lb 7 oz (2.92 kg) F Vag-Spont None N LIV    Past Medical History:  Diagnosis Date   BMI 39.0-39.9,adult 10/06/2012   Diabetes mellitus without complication ([place])    Gall stones 2013   H. pylori infection 2017   Varicose veins of both lower extremities     Past Surgical History:  Procedure Laterality Date   CHOLECYSTECTOMY  10/01/2012   Procedure: LAPAROSCOPIC CHOLECYSTECTOMY WITH INTRAOPERATIVE CHOLANGIOGRAM;  Surgeon: Rolm Bookbinder, MD;  Location: [place];  Service: General;  Laterality: N/A;   ERCP  10/05/2012   Procedure: ENDOSCOPIC RETROGRADE CHOLANGIOPANCREATOGRAPHY (ERCP);   Surgeon: Milus Banister, MD;  Location: [place];  Service: Endoscopy;  Laterality: N/A;    Current Outpatient Medications on File Prior to Visit  Medication Sig Dispense Refill   levothyroxine (SYNTHROID) 100 MCG tablet Take 100 mcg by mouth daily before breakfast.     Prenatal Vit-Fe Fumarate-FA (PRENATAL MULTIVITAMIN) TABS tablet Take 1 tablet by mouth at bedtime.  (Patient not taking: Reported on 12/15/2022)     No current facility-administered medications on file prior to visit.    No Known Allergies  Social History:  reports that she has never smoked. She has never used smokeless tobacco. She reports that she does not drink alcohol and does not use drugs.  Family History  Problem Relation Age of Onset   Hypertension Mother    Hypertension Father    Asthma Sister     The following portions of the patient's history were reviewed and updated as appropriate: allergies, current medications, past family history, past medical history, past social history, past surgical history and problem list.  Review of Systems Pertinent items noted in HPI and remainder of comprehensive ROS otherwise negative.  Physical Exam:  LMP 11/18/2022   Breastfeeding No  Physical Exam Vitals and nursing note reviewed.  Constitutional:      Appearance: Normal appearance.  Cardiovascular:     Rate and Rhythm: Normal rate and  regular rhythm.  Pulmonary:     Effort: Pulmonary effort is normal.     Breath sounds: Normal breath sounds.  Neurological:     General: No focal deficit present.     Mental Status: She is alert and oriented to person, place, and time.  Psychiatric:        Mood and Affect: Mood normal.        Behavior: Behavior normal.        Thought Content: Thought content normal.        Judgment: Judgment normal.        Assessment and Plan:   1. SAB (spontaneous abortion) Hcg on 1/4 negative. No complications.   2. Encounter for counseling regarding contraception Reviewed all forms  of birth control options available including abstinence; over the counter/barrier methods; hormonal contraceptive medication including pill, patch, ring, injection,contraceptive implant; hormonal and nonhormonal IUDs; permanent sterilization options including vasectomy and the various tubal sterilization modalities. Risks and benefits reviewed.  Questions were answered.  Information was given to patient to review.  Most interested in sterilization but does not currently have insurance. Will discuss vasectomy with partner again.   3. Language barrier Interpreter used for duration of encounter  Routine preventative health maintenance measures emphasized. Please refer to After Visit Summary for other counseling recommendations.   Follow-up: No follow-ups on file.      Darliss Cheney, MD Obstetrician & Gynecologist, [facility] [employer], [place]
